# Patient Record
Sex: Female | Born: 1945 | Race: White | Hispanic: No | Smoking: Former smoker
Health system: Southern US, Community
[De-identification: ages and names within clinical notes are randomized; demographics above are authoritative.]

## PROBLEM LIST (undated history)

## (undated) DIAGNOSIS — T8859XA Other complications of anesthesia, initial encounter: Secondary | ICD-10-CM

## (undated) DIAGNOSIS — Z972 Presence of dental prosthetic device (complete) (partial): Secondary | ICD-10-CM

## (undated) DIAGNOSIS — E039 Hypothyroidism, unspecified: Secondary | ICD-10-CM

## (undated) DIAGNOSIS — Z8489 Family history of other specified conditions: Secondary | ICD-10-CM

## (undated) DIAGNOSIS — I10 Essential (primary) hypertension: Secondary | ICD-10-CM

## (undated) DIAGNOSIS — M5430 Sciatica, unspecified side: Secondary | ICD-10-CM

## (undated) HISTORY — PX: CHOLECYSTECTOMY: SHX55

## (undated) HISTORY — PX: ABDOMINAL HYSTERECTOMY: SHX81

## (undated) HISTORY — PX: PARATHYROIDECTOMY: SHX19

---

## 2003-10-26 ENCOUNTER — Encounter: Payer: Self-pay | Admitting: Family Medicine

## 2003-11-17 ENCOUNTER — Encounter: Payer: Self-pay | Admitting: Family Medicine

## 2003-12-17 ENCOUNTER — Encounter: Payer: Self-pay | Admitting: Family Medicine

## 2004-01-17 ENCOUNTER — Encounter: Payer: Self-pay | Admitting: Family Medicine

## 2004-02-17 ENCOUNTER — Encounter: Payer: Self-pay | Admitting: Family Medicine

## 2004-03-16 ENCOUNTER — Encounter: Payer: Self-pay | Admitting: Family Medicine

## 2004-04-16 ENCOUNTER — Encounter: Payer: Self-pay | Admitting: Family Medicine

## 2004-05-10 ENCOUNTER — Ambulatory Visit: Payer: Self-pay | Admitting: Family Medicine

## 2004-05-16 ENCOUNTER — Encounter: Payer: Self-pay | Admitting: Family Medicine

## 2004-06-16 ENCOUNTER — Encounter: Payer: Self-pay | Admitting: Family Medicine

## 2004-07-16 ENCOUNTER — Encounter: Payer: Self-pay | Admitting: Family Medicine

## 2004-08-16 ENCOUNTER — Encounter: Payer: Self-pay | Admitting: Family Medicine

## 2004-09-16 ENCOUNTER — Encounter: Payer: Self-pay | Admitting: Family Medicine

## 2004-10-16 ENCOUNTER — Encounter: Payer: Self-pay | Admitting: Family Medicine

## 2004-11-16 ENCOUNTER — Encounter: Payer: Self-pay | Admitting: Family Medicine

## 2004-12-16 ENCOUNTER — Encounter: Payer: Self-pay | Admitting: Family Medicine

## 2004-12-28 ENCOUNTER — Ambulatory Visit: Payer: Self-pay | Admitting: Ophthalmology

## 2005-01-16 ENCOUNTER — Encounter: Payer: Self-pay | Admitting: Family Medicine

## 2005-02-16 ENCOUNTER — Encounter: Payer: Self-pay | Admitting: Family Medicine

## 2005-03-16 ENCOUNTER — Encounter: Payer: Self-pay | Admitting: Family Medicine

## 2005-04-16 ENCOUNTER — Encounter: Payer: Self-pay | Admitting: Family Medicine

## 2005-05-16 ENCOUNTER — Encounter: Payer: Self-pay | Admitting: Family Medicine

## 2005-06-16 ENCOUNTER — Encounter: Payer: Self-pay | Admitting: Family Medicine

## 2005-07-16 ENCOUNTER — Encounter: Payer: Self-pay | Admitting: Family Medicine

## 2005-07-18 ENCOUNTER — Ambulatory Visit: Payer: Self-pay | Admitting: Family Medicine

## 2005-08-16 ENCOUNTER — Encounter: Payer: Self-pay | Admitting: Family Medicine

## 2005-09-16 ENCOUNTER — Encounter: Payer: Self-pay | Admitting: Family Medicine

## 2005-10-16 ENCOUNTER — Encounter: Payer: Self-pay | Admitting: Family Medicine

## 2005-11-16 ENCOUNTER — Encounter: Payer: Self-pay | Admitting: Family Medicine

## 2005-12-16 ENCOUNTER — Encounter: Payer: Self-pay | Admitting: Family Medicine

## 2005-12-19 ENCOUNTER — Ambulatory Visit: Payer: Self-pay | Admitting: Family Medicine

## 2006-01-16 ENCOUNTER — Encounter: Payer: Self-pay | Admitting: Family Medicine

## 2006-02-16 ENCOUNTER — Encounter: Payer: Self-pay | Admitting: Family Medicine

## 2006-03-17 ENCOUNTER — Encounter: Payer: Self-pay | Admitting: Family Medicine

## 2006-04-17 ENCOUNTER — Encounter: Payer: Self-pay | Admitting: Family Medicine

## 2006-05-17 ENCOUNTER — Encounter: Payer: Self-pay | Admitting: Family Medicine

## 2006-06-17 ENCOUNTER — Encounter: Payer: Self-pay | Admitting: Family Medicine

## 2007-03-08 ENCOUNTER — Emergency Department: Payer: Self-pay | Admitting: Emergency Medicine

## 2009-10-14 ENCOUNTER — Ambulatory Visit: Payer: Self-pay | Admitting: Family Medicine

## 2009-12-27 ENCOUNTER — Ambulatory Visit: Payer: Self-pay | Admitting: Family Medicine

## 2010-02-11 ENCOUNTER — Ambulatory Visit: Payer: Self-pay | Admitting: Family Medicine

## 2010-11-09 ENCOUNTER — Ambulatory Visit: Payer: Self-pay | Admitting: Family Medicine

## 2011-11-14 ENCOUNTER — Ambulatory Visit: Payer: Self-pay | Admitting: Family Medicine

## 2012-06-17 ENCOUNTER — Ambulatory Visit: Payer: Self-pay | Admitting: Gastroenterology

## 2012-06-18 LAB — PATHOLOGY REPORT

## 2012-11-14 ENCOUNTER — Ambulatory Visit: Payer: Self-pay | Admitting: Family Medicine

## 2013-12-16 ENCOUNTER — Ambulatory Visit: Payer: Self-pay | Admitting: Ophthalmology

## 2013-12-16 LAB — SEDIMENTATION RATE: ERYTHROCYTE SED RATE: 28 mm/h (ref 0–30)

## 2013-12-22 ENCOUNTER — Ambulatory Visit: Payer: Self-pay | Admitting: Ophthalmology

## 2014-05-18 ENCOUNTER — Other Ambulatory Visit: Payer: Self-pay

## 2014-05-18 DIAGNOSIS — Z1231 Encounter for screening mammogram for malignant neoplasm of breast: Secondary | ICD-10-CM

## 2014-06-02 ENCOUNTER — Ambulatory Visit: Payer: Self-pay

## 2014-11-02 ENCOUNTER — Ambulatory Visit
Admission: RE | Admit: 2014-11-02 | Discharge: 2014-11-02 | Disposition: A | Discharge status: Home / Self Care | Payer: Medicare Other | Source: Ambulatory Visit | Attending: Family Medicine | Admitting: Family Medicine

## 2014-11-02 DIAGNOSIS — Z1231 Encounter for screening mammogram for malignant neoplasm of breast: Secondary | ICD-10-CM | POA: Diagnosis present

## 2015-10-19 ENCOUNTER — Other Ambulatory Visit: Payer: Self-pay | Admitting: Family Medicine

## 2015-10-19 DIAGNOSIS — Z1231 Encounter for screening mammogram for malignant neoplasm of breast: Secondary | ICD-10-CM

## 2015-11-10 ENCOUNTER — Ambulatory Visit
Admission: RE | Admit: 2015-11-10 | Discharge: 2015-11-10 | Disposition: A | Discharge status: Home / Self Care | Payer: Medicare Other | Source: Ambulatory Visit | Attending: Family Medicine | Admitting: Family Medicine

## 2015-11-10 DIAGNOSIS — Z1231 Encounter for screening mammogram for malignant neoplasm of breast: Secondary | ICD-10-CM | POA: Insufficient documentation

## 2016-04-25 ENCOUNTER — Other Ambulatory Visit: Payer: Self-pay | Admitting: Family Medicine

## 2016-04-25 ENCOUNTER — Ambulatory Visit
Admission: RE | Admit: 2016-04-25 | Discharge: 2016-04-25 | Disposition: A | Discharge status: Home / Self Care | Payer: Medicare HMO | Source: Ambulatory Visit | Attending: Family Medicine | Admitting: Family Medicine

## 2016-04-25 ENCOUNTER — Ambulatory Visit: Payer: Medicare HMO

## 2016-04-25 DIAGNOSIS — R937 Abnormal findings on diagnostic imaging of other parts of musculoskeletal system: Secondary | ICD-10-CM | POA: Diagnosis not present

## 2016-04-25 DIAGNOSIS — M79661 Pain in right lower leg: Secondary | ICD-10-CM

## 2016-11-03 ENCOUNTER — Other Ambulatory Visit: Payer: Self-pay | Admitting: Family Medicine

## 2016-11-03 DIAGNOSIS — Z1231 Encounter for screening mammogram for malignant neoplasm of breast: Secondary | ICD-10-CM

## 2016-11-16 ENCOUNTER — Ambulatory Visit
Admission: RE | Admit: 2016-11-16 | Discharge: 2016-11-16 | Disposition: A | Discharge status: Home / Self Care | Payer: Medicare HMO | Source: Ambulatory Visit | Attending: Family Medicine | Admitting: Family Medicine

## 2016-11-16 DIAGNOSIS — Z1231 Encounter for screening mammogram for malignant neoplasm of breast: Secondary | ICD-10-CM | POA: Diagnosis present

## 2017-07-09 ENCOUNTER — Other Ambulatory Visit: Payer: Self-pay | Admitting: Medical Oncology

## 2017-07-09 ENCOUNTER — Ambulatory Visit
Admission: RE | Admit: 2017-07-09 | Discharge: 2017-07-09 | Disposition: A | Discharge status: Home / Self Care | Payer: Medicare HMO | Source: Ambulatory Visit | Attending: Medical Oncology | Admitting: Medical Oncology

## 2017-07-09 DIAGNOSIS — R0989 Other specified symptoms and signs involving the circulatory and respiratory systems: Secondary | ICD-10-CM

## 2017-07-09 DIAGNOSIS — R05 Cough: Secondary | ICD-10-CM | POA: Diagnosis present

## 2017-07-09 DIAGNOSIS — R058 Other specified cough: Secondary | ICD-10-CM

## 2017-07-09 DIAGNOSIS — Z87891 Personal history of nicotine dependence: Secondary | ICD-10-CM | POA: Insufficient documentation

## 2017-07-09 DIAGNOSIS — R509 Fever, unspecified: Secondary | ICD-10-CM | POA: Diagnosis present

## 2017-07-09 DIAGNOSIS — J984 Other disorders of lung: Secondary | ICD-10-CM | POA: Diagnosis not present

## 2017-07-13 ENCOUNTER — Other Ambulatory Visit: Payer: Self-pay

## 2017-07-13 ENCOUNTER — Emergency Department
Admission: EM | Admit: 2017-07-13 | Discharge: 2017-07-14 | Disposition: A | Discharge status: Home / Self Care | Payer: Medicare HMO | Attending: Emergency Medicine | Admitting: Emergency Medicine

## 2017-07-13 DIAGNOSIS — E876 Hypokalemia: Secondary | ICD-10-CM | POA: Insufficient documentation

## 2017-07-13 DIAGNOSIS — R509 Fever, unspecified: Secondary | ICD-10-CM | POA: Diagnosis present

## 2017-07-13 DIAGNOSIS — J069 Acute upper respiratory infection, unspecified: Secondary | ICD-10-CM | POA: Insufficient documentation

## 2017-07-13 LAB — BASIC METABOLIC PANEL
Anion gap: 10 (ref 5–15)
BUN: 15 mg/dL (ref 8–23)
CO2: 23 mmol/L (ref 22–32)
CREATININE: 0.77 mg/dL (ref 0.44–1.00)
Calcium: 8.6 mg/dL — ABNORMAL LOW (ref 8.9–10.3)
Chloride: 102 mmol/L (ref 98–111)
GFR calc Af Amer: >60 mL/min (ref >60)
GLUCOSE: 106 mg/dL — AB (ref 70–99)
Potassium: 3 mmol/L — ABNORMAL LOW (ref 3.5–5.1)
SODIUM: 135 mmol/L (ref 135–145)

## 2017-07-13 LAB — URINALYSIS, COMPLETE (UACMP) WITH MICROSCOPIC
Bilirubin Urine: NEGATIVE
GLUCOSE, UA: NEGATIVE mg/dL
HGB URINE DIPSTICK: NEGATIVE
Ketones, ur: NEGATIVE mg/dL
Nitrite: NEGATIVE
PROTEIN: NEGATIVE mg/dL
SPECIFIC GRAVITY, URINE: 1.005 (ref 1.005–1.030)
pH: 6 (ref 5.0–8.0)

## 2017-07-13 LAB — CBC WITH DIFFERENTIAL/PLATELET
Basophils Absolute: 0.1 10*3/uL (ref 0–0.1)
Basophils Relative: 1 %
EOS ABS: 0.1 10*3/uL (ref 0–0.7)
Eosinophils Relative: 0 %
HCT: 41 % (ref 35.0–47.0)
Hemoglobin: 14.1 g/dL (ref 12.0–16.0)
LYMPHS ABS: 2.4 10*3/uL (ref 1.0–3.6)
Lymphocytes Relative: 19 %
MCH: 29.9 pg (ref 26.0–34.0)
MCHC: 34.4 g/dL (ref 32.0–36.0)
MCV: 87 fL (ref 80.0–100.0)
MONO ABS: 0.9 10*3/uL (ref 0.2–0.9)
Monocytes Relative: 7 %
Neutro Abs: 9.4 10*3/uL — ABNORMAL HIGH (ref 1.4–6.5)
Neutrophils Relative %: 73 %
Platelets: 281 10*3/uL (ref 150–440)
RBC: 4.72 MIL/uL (ref 3.80–5.20)
RDW: 13.5 % (ref 11.5–14.5)
WBC: 13 10*3/uL — AB (ref 3.6–11.0)

## 2017-07-13 MED ORDER — POTASSIUM CHLORIDE CRYS ER 20 MEQ PO TBCR
40.0000 meq | EXTENDED_RELEASE_TABLET | Freq: Once | ORAL | Status: AC
  Administered 2017-07-14: 40 meq via ORAL
  Filled 2017-07-13: qty 2

## 2017-07-13 NOTE — ED Triage Notes (Signed)
Pt in with co fever and cough since last Wednesday. States saw PA Wednesday and given doxycycline and otc decongestant. Unsure of dx, states cxr was neg. Pt here for persistent symptoms.

## 2017-07-13 NOTE — ED Provider Notes (Signed)
[facility] Emergency Department Provider Note  ____________________________________________   I have reviewed the triage vital signs and the nursing notes.   HISTORY  Chief Complaint Fever   History limited by: Not Limited   HPI Toni Mcgee is a 72 y.o. female who presents to the emergency department today because of concerns for continued fever.  She states she has had fever for over 1 week.  T-max of 100.9.  Patient saw a PA for this a few days ago and was diagnosed with community-acquired pneumonia was put on doxycycline.  She then subsequently had an x-ray which was negative for pneumonia.  Patient states that she continues to have the fevers.  In addition she has felt somewhat weak and lethargic.  She has been taking care to make sure she is getting enough fluids.  She denies any chest pain.  No nausea vomiting or diarrhea. She has not taken any medication for her fever. Denies any rashes.   Per medical record review patient has a history of recent visit to [place] where she was afebrile, started on doxycycline.  No past medical history on file.  There are no active problems to display for this patient.   No past surgical history on file.  Prior to Admission medications   Not on File    Allergies Penicillins; Sulfa antibiotics; and Tylenol [acetaminophen]  No family history on file.  Social History Former smoker  Review of Systems Constitutional: Positive for fever. Eyes: No visual changes. ENT: No sore throat. Cardiovascular: Denies chest pain. Respiratory: Denies shortness of breath. Gastrointestinal: No abdominal pain.  No nausea, no vomiting.  No diarrhea.   Genitourinary: Negative for dysuria. Musculoskeletal: Negative for back pain. Skin: Negative for rash. Neurological: Negative for headaches, focal weakness or numbness.  ____________________________________________   PHYSICAL EXAM:  VITAL SIGNS: ED Triage Vitals  [07/13/17 2212]  Enc Vitals Group     BP 132/81     Pulse Rate 83     Resp 20     Temp 98.9 F (37.2 C)     Temp Source Oral     SpO2 95 %     Weight 194 lb (88 kg)     Height 5\' 1"  (1.549 m)     Head Circumference      Peak Flow      Pain Score 0   Constitutional: Alert and oriented.  Eyes: Conjunctivae are normal.  ENT      Head: Normocephalic and atraumatic.      Nose: No congestion/rhinnorhea.      Mouth/Throat: Mucous membranes are moist.      Neck: No stridor. Hematological/Lymphatic/Immunilogical: No cervical lymphadenopathy. Cardiovascular: Normal rate, regular rhythm.  No murmurs, rubs, or gallops.  Respiratory: Normal respiratory effort without tachypnea nor retractions. Breath sounds are clear and equal bilaterally. No wheezes/rales/rhonchi. Gastrointestinal: Soft and non tender. No rebound. No guarding.  Genitourinary: Deferred Musculoskeletal: Normal range of motion in all extremities. No lower extremity edema. Neurologic:  Normal speech and language. No gross focal neurologic deficits are appreciated.  Skin:  Skin is warm, dry and intact. No rash noted. Psychiatric: Mood and affect are normal. Speech and behavior are normal. Patient exhibits appropriate insight and judgment.  ____________________________________________    LABS (pertinent positives/negatives)  CBC wbc 13.0, hgb 14.1, plt 281 BMP na 135, k 3.0, glu 106, cr 0.77 UA not consistent with infection  ____________________________________________   EKG  None  ____________________________________________    RADIOLOGY  None  ____________________________________________  PROCEDURES  Procedures  ____________________________________________   INITIAL IMPRESSION / ASSESSMENT AND PLAN / ED COURSE  Pertinent labs & imaging results that were available during my care of the patient were reviewed by me and considered in my medical decision making (see chart for details).   Patient  presented to the emergency department today because of concerns for continued fever.  Patient has been seen by primary care for this and was put on doxycycline.  Additionally had a negative chest x-ray.  Work-up does show a mild leukocytosis.  Patient was also found to be hypokalemic.  Urine without concerning signs for infection.  This point I think likely upper respiratory infection likely secondary to virus.  Patient is already on get antibiotics if it is a community-acquired pneumonia.  Did discuss this with patient.  Did encourage patient to continue antibiotics.  Also give patient potassium repletion here in the emergency department.   ____________________________________________   FINAL CLINICAL IMPRESSION(S) / ED DIAGNOSES  Final diagnoses:  Upper respiratory tract infection, unspecified type  Hypokalemia     Note: This dictation was prepared with Dragon dictation. Any transcriptional errors that result from this process are unintentional     Phineas SemenGoodman, Justeen Hehr, MD 07/13/17 [phone]

## 2017-07-13 NOTE — Discharge Instructions (Addendum)
Procedures - None ° °Please seek medical attention for any high fevers, chest pain, shortness of breath, change in behavior, persistent vomiting, bloody stool or any other new or concerning symptoms. ° ° ° °

## 2017-10-03 ENCOUNTER — Other Ambulatory Visit: Payer: Self-pay | Admitting: Family Medicine

## 2017-10-03 DIAGNOSIS — Z1231 Encounter for screening mammogram for malignant neoplasm of breast: Secondary | ICD-10-CM

## 2017-11-19 ENCOUNTER — Ambulatory Visit
Admission: RE | Admit: 2017-11-19 | Discharge: 2017-11-19 | Disposition: A | Discharge status: Home / Self Care | Payer: Medicare HMO | Source: Ambulatory Visit | Attending: Family Medicine | Admitting: Family Medicine

## 2017-11-19 DIAGNOSIS — Z1231 Encounter for screening mammogram for malignant neoplasm of breast: Secondary | ICD-10-CM

## 2018-10-07 ENCOUNTER — Other Ambulatory Visit: Payer: Self-pay | Admitting: Family Medicine

## 2018-10-07 DIAGNOSIS — Z1231 Encounter for screening mammogram for malignant neoplasm of breast: Secondary | ICD-10-CM

## 2018-11-26 ENCOUNTER — Ambulatory Visit
Admission: RE | Admit: 2018-11-26 | Discharge: 2018-11-26 | Disposition: A | Discharge status: Home / Self Care | Payer: Medicare HMO | Source: Ambulatory Visit | Attending: Family Medicine | Admitting: Family Medicine

## 2018-11-26 DIAGNOSIS — Z1231 Encounter for screening mammogram for malignant neoplasm of breast: Secondary | ICD-10-CM | POA: Diagnosis present

## 2019-02-24 ENCOUNTER — Ambulatory Visit: Payer: Medicare HMO

## 2019-03-14 ENCOUNTER — Ambulatory Visit: Payer: Medicare HMO

## 2019-04-30 ENCOUNTER — Encounter: Payer: Self-pay | Admitting: Emergency Medicine

## 2019-04-30 ENCOUNTER — Other Ambulatory Visit: Payer: Self-pay

## 2019-04-30 DIAGNOSIS — R42 Dizziness and giddiness: Secondary | ICD-10-CM | POA: Diagnosis not present

## 2019-04-30 DIAGNOSIS — Z87891 Personal history of nicotine dependence: Secondary | ICD-10-CM | POA: Diagnosis not present

## 2019-04-30 DIAGNOSIS — I10 Essential (primary) hypertension: Secondary | ICD-10-CM | POA: Insufficient documentation

## 2019-04-30 DIAGNOSIS — R2681 Unsteadiness on feet: Secondary | ICD-10-CM | POA: Insufficient documentation

## 2019-04-30 DIAGNOSIS — R197 Diarrhea, unspecified: Secondary | ICD-10-CM | POA: Diagnosis not present

## 2019-04-30 LAB — COMPREHENSIVE METABOLIC PANEL
ALT: 28 U/L (ref 0–44)
AST: 27 U/L (ref 15–41)
Albumin: 3.9 g/dL (ref 3.5–5.0)
Alkaline Phosphatase: 52 U/L (ref 38–126)
Anion gap: 8 (ref 5–15)
BUN: 11 mg/dL (ref 8–23)
CO2: 27 mmol/L (ref 22–32)
Calcium: 9.4 mg/dL (ref 8.9–10.3)
Chloride: 107 mmol/L (ref 98–111)
Creatinine, Ser: 0.8 mg/dL (ref 0.44–1.00)
GFR calc Af Amer: >60 mL/min (ref >60)
GFR calc non Af Amer: >60 mL/min (ref >60)
Glucose, Bld: 124 mg/dL — ABNORMAL HIGH (ref 70–99)
Potassium: 4.1 mmol/L (ref 3.5–5.1)
Sodium: 142 mmol/L (ref 135–145)
Total Bilirubin: 0.9 mg/dL (ref 0.3–1.2)
Total Protein: 7.7 g/dL (ref 6.5–8.1)

## 2019-04-30 LAB — CBC WITH DIFFERENTIAL/PLATELET
Abs Immature Granulocytes: 0.04 10*3/uL (ref 0.00–0.07)
Basophils Absolute: 0.1 10*3/uL (ref 0.0–0.1)
Basophils Relative: 1 %
Eosinophils Absolute: 0.2 10*3/uL (ref 0.0–0.5)
Eosinophils Relative: 2 %
HCT: 45 % (ref 36.0–46.0)
Hemoglobin: 14.7 g/dL (ref 12.0–15.0)
Immature Granulocytes: 0 %
Lymphocytes Relative: 32 %
Lymphs Abs: 3.3 10*3/uL (ref 0.7–4.0)
MCH: 29.5 pg (ref 26.0–34.0)
MCHC: 32.7 g/dL (ref 30.0–36.0)
MCV: 90.2 fL (ref 80.0–100.0)
Monocytes Absolute: 0.6 10*3/uL (ref 0.1–1.0)
Monocytes Relative: 6 %
Neutro Abs: 5.9 10*3/uL (ref 1.7–7.7)
Neutrophils Relative %: 59 %
Platelets: 291 10*3/uL (ref 150–400)
RBC: 4.99 MIL/uL (ref 3.87–5.11)
RDW: 14.4 % (ref 11.5–15.5)
WBC: 10.1 10*3/uL (ref 4.0–10.5)
nRBC: 0 % (ref 0.0–0.2)

## 2019-04-30 LAB — URINALYSIS, COMPLETE (UACMP) WITH MICROSCOPIC
Bilirubin Urine: NEGATIVE
Glucose, UA: NEGATIVE mg/dL
Hgb urine dipstick: NEGATIVE
Ketones, ur: NEGATIVE mg/dL
Nitrite: NEGATIVE
Protein, ur: NEGATIVE mg/dL
Specific Gravity, Urine: 1.002 — ABNORMAL LOW (ref 1.005–1.030)
pH: 6 (ref 5.0–8.0)

## 2019-04-30 LAB — TROPONIN I (HIGH SENSITIVITY): Troponin I (High Sensitivity): 5 ng/L (ref <18)

## 2019-04-30 NOTE — ED Triage Notes (Signed)
Pt to triage via w/c with no distress noted, mask in place; pt reports had diarrhea last Thursday that lasted until Sunday; tonight began having dizziness with no accomp symptoms

## 2019-05-01 ENCOUNTER — Emergency Department
Admission: EM | Admit: 2019-05-01 | Discharge: 2019-05-01 | Disposition: A | Discharge status: Home / Self Care | Payer: Medicare HMO | Attending: Emergency Medicine | Admitting: Emergency Medicine

## 2019-05-01 ENCOUNTER — Emergency Department: Payer: Medicare HMO

## 2019-05-01 DIAGNOSIS — R42 Dizziness and giddiness: Secondary | ICD-10-CM

## 2019-05-01 HISTORY — DX: Essential (primary) hypertension: I10

## 2019-05-01 NOTE — ED Provider Notes (Signed)
[facility] Emergency Department Provider Note  ____________________________________________   First MD Initiated Contact with Patient 05/01/19 0147     (approximate)  I have reviewed the triage vital signs and the nursing notes.   HISTORY  Chief Complaint Dizziness   HPI Toni Mcgee is a 74 y.o. female with history of hypertension and recent nonbloody diarrhea that lasted 4 days last week presents to the emergency department secondary to acute onset of dizziness and unsteady gait tonight.  Patient states initial episode occurred while she was standing and subsequent episode occurred when she change positions from laying to sitting up.  Patient denies any chest pain no shortness of breath.  Patient denies any weakness or numbness.  Patient denies any change in speech.        Past Medical History:  Diagnosis Date  . Hypertension     There are no problems to display for this patient.   Past Surgical History:  Procedure Laterality Date  . ABDOMINAL HYSTERECTOMY    . CHOLECYSTECTOMY    . PARATHYROIDECTOMY      Prior to Admission medications   Not on File    Allergies Penicillins, Sulfa antibiotics, and Tylenol [acetaminophen]  Family History  Problem Relation Age of Onset  . Breast cancer Neg Hx     Social History Social History   Tobacco Use  . Smoking status: Former Games developer  . Smokeless tobacco: Never Used  Substance Use Topics  . Alcohol use: Not on file  . Drug use: Not on file    Review of Systems Constitutional: No fever/chills Eyes: No visual changes. ENT: No sore throat. Cardiovascular: Denies chest pain. Respiratory: Denies shortness of breath. Gastrointestinal: No abdominal pain.  No nausea, no vomiting.  No diarrhea.  No constipation. Genitourinary: Negative for dysuria. Musculoskeletal: Negative for neck pain.  Negative for back pain. Integumentary: Negative for rash. Neurological: Negative for headaches,  focal weakness or numbness.  Positive for dizziness and an unsteady gait   ____________________________________________   PHYSICAL EXAM:  VITAL SIGNS: ED Triage Vitals  Enc Vitals Group     BP 04/30/19 2041 (!) 173/68     Pulse Rate 04/30/19 2041 60     Resp 04/30/19 2041 18     Temp 04/30/19 2041 98 F (36.7 C)     Temp src --      SpO2 04/30/19 2041 98 %     Weight 04/30/19 2038 88.9 kg (196 lb)     Height 04/30/19 2038 1.6 m (5\' 3" )     Head Circumference --      Peak Flow --      Pain Score 04/30/19 2038 0     Pain Loc --      Pain Edu? --      Excl. in GC? --     Constitutional: Alert and oriented.  Eyes: Conjunctivae are normal.  ENT: Clear fluid noted posterior TM bilaterally. Mouth/Throat: Patient is wearing a mask. Neck: No stridor.  No meningeal signs.   Cardiovascular: Normal rate, regular rhythm. Good peripheral circulation. Grossly normal heart sounds. Respiratory: Normal respiratory effort.  No retractions. Gastrointestinal: Soft and nontender. No distention.  Musculoskeletal: No lower extremity tenderness nor edema. No gross deformities of extremities. Neurologic:  Normal speech and language. No gross focal neurologic deficits are appreciated.  Skin:  Skin is warm, dry and intact. Psychiatric: Mood and affect are normal. Speech and behavior are normal.  ____________________________________________   LABS (all labs ordered are listed,  but only abnormal results are displayed)  Labs Reviewed  COMPREHENSIVE METABOLIC PANEL - Abnormal; Notable for the following components:      Result Value   Glucose, Bld 124 (*)    All other components within normal limits  URINALYSIS, COMPLETE (UACMP) WITH MICROSCOPIC - Abnormal; Notable for the following components:   Color, Urine STRAW (*)    APPearance CLEAR (*)    Specific Gravity, Urine 1.002 (*)    Leukocytes,Ua TRACE (*)    Bacteria, UA RARE (*)    All other components within normal limits  CBC WITH  DIFFERENTIAL/PLATELET  TROPONIN I (HIGH SENSITIVITY)   ____________________________________________  EKG  ED ECG REPORT I, Bardwell N Fleeta Kunde, the attending physician, personally viewed and interpreted this ECG.   Date: 05/01/2019  EKG Time: 8:41 PM  Rate: 61  Rhythm: Normal sinus rhythm  Axis: Normal  Intervals: Normal  ST&T Change: None  ____________________________________________  RADIOLOGY I, Vilonia N Jabron Weese, personally viewed and evaluated these images (plain radiographs) as part of my medical decision making, as well as reviewing the written report by the radiologist.  ED MD interpretation: Unremarkable brain MRI per radiologist.  Official radiology   MR BRAIN WO CONTRAST  Result Date: 05/01/2019 CLINICAL DATA:  New onset dizziness and ataxia EXAM: MRI HEAD WITHOUT CONTRAST TECHNIQUE: Multiplanar, multiecho pulse sequences of the brain and surrounding structures were obtained without intravenous contrast. COMPARISON:  None. FINDINGS: Brain: No acute infarction, hemorrhage, hydrocephalus, extra-axial collection or mass lesion. Few tiny remote insults in the cerebral white matter, less than commonly seen for age. Brain volume is normal. Vascular: Normal flow voids Skull and upper cervical spine: Degenerative spurring in the visualized spine. There is C2-3 facet ankylosis. No focal lesion seen. Sinuses/Orbits: Unremarkable. No mastoid or middle ear opacification. IMPRESSION: Unremarkable brain MRI.  No cerebellar infarct. Electronically Signed   By: Monte Fantasia M.D.   On: 05/01/2019 04:19      Procedures   ____________________________________________   INITIAL IMPRESSION / MDM / ASSESSMENT AND PLAN / ED COURSE  As part of my medical decision making, I reviewed the following data within the [place] NUMBER 74 year old female presented with above-stated history and physical exam secondary to dizziness and ataxia differential diagnosis including but not  limited to orthostatic hypotension, cardiac arrhythmia CVA vertigo secondary to inner ear pathology.  Patient's laboratory data unremarkable.  MRI of the brain also unremarkable.  ____________________________________________  FINAL CLINICAL IMPRESSION(S) / ED DIAGNOSES  Final diagnoses:  Dizziness     MEDICATIONS GIVEN DURING THIS VISIT:  Medications - No data to display   ED Discharge Orders    None      *Please note:  Reva Pinkley Figler was evaluated in Emergency Department on 05/01/2019 for the symptoms described in the history of present illness. She was evaluated in the context of the global COVID-19 pandemic, which necessitated consideration that the patient might be at risk for infection with the SARS-CoV-2 virus that causes COVID-19. Institutional protocols and algorithms that pertain to the evaluation of patients at risk for COVID-19 are in a state of rapid change based on information released by regulatory bodies including the CDC and federal and state organizations. These policies and algorithms were followed during the patient's care in the ED.  Some ED evaluations and interventions may be delayed as a result of limited staffing during the pandemic.*  Note:  This document was prepared using Dragon voice recognition software and may include unintentional dictation errors.   Owens Shark,  Enedina Finner, MD 05/01/19 [phone]

## 2019-05-01 NOTE — ED Notes (Signed)
Patient discharged to home per MD order. Patient in stable condition, and deemed medically cleared by ED provider for discharge. Discharge instructions reviewed with patient/family using "Teach Back"; verbalized understanding of medication education and administration, and information about follow-up care. Denies further concerns. ° °

## 2019-10-28 ENCOUNTER — Other Ambulatory Visit: Payer: Self-pay | Admitting: Family Medicine

## 2019-10-28 DIAGNOSIS — Z1231 Encounter for screening mammogram for malignant neoplasm of breast: Secondary | ICD-10-CM

## 2019-11-27 ENCOUNTER — Ambulatory Visit
Admission: RE | Admit: 2019-11-27 | Discharge: 2019-11-27 | Disposition: A | Discharge status: Home / Self Care | Payer: Medicare HMO | Source: Ambulatory Visit | Attending: Family Medicine | Admitting: Family Medicine

## 2019-11-27 ENCOUNTER — Other Ambulatory Visit: Payer: Self-pay

## 2019-11-27 DIAGNOSIS — Z1231 Encounter for screening mammogram for malignant neoplasm of breast: Secondary | ICD-10-CM | POA: Insufficient documentation

## 2020-01-20 ENCOUNTER — Ambulatory Visit
Admission: EM | Admit: 2020-01-20 | Discharge: 2020-01-20 | Disposition: A | Discharge status: Home / Self Care | Payer: Medicare HMO | Attending: Physician Assistant | Admitting: Physician Assistant

## 2020-01-20 ENCOUNTER — Ambulatory Visit (INDEPENDENT_AMBULATORY_CARE_PROVIDER_SITE_OTHER): Payer: Medicare HMO

## 2020-01-20 ENCOUNTER — Other Ambulatory Visit: Payer: Self-pay

## 2020-01-20 DIAGNOSIS — R5383 Other fatigue: Secondary | ICD-10-CM

## 2020-01-20 DIAGNOSIS — Z20822 Contact with and (suspected) exposure to covid-19: Secondary | ICD-10-CM | POA: Insufficient documentation

## 2020-01-20 DIAGNOSIS — R058 Other specified cough: Secondary | ICD-10-CM

## 2020-01-20 DIAGNOSIS — R0602 Shortness of breath: Secondary | ICD-10-CM | POA: Insufficient documentation

## 2020-01-20 DIAGNOSIS — J029 Acute pharyngitis, unspecified: Secondary | ICD-10-CM

## 2020-01-20 DIAGNOSIS — B349 Viral infection, unspecified: Secondary | ICD-10-CM | POA: Diagnosis present

## 2020-01-20 DIAGNOSIS — R059 Cough, unspecified: Secondary | ICD-10-CM | POA: Diagnosis not present

## 2020-01-20 MED ORDER — ALBUTEROL SULFATE HFA 108 (90 BASE) MCG/ACT IN AERS: 1.0000 | INHALATION_SPRAY | Freq: Four times a day (QID) | RESPIRATORY_TRACT | 0 refills | Status: AC | PRN

## 2020-01-20 NOTE — ED Triage Notes (Signed)
Pt reports having productive cough, fatigue, sore throat and swollen glands in neck. Also reports having headache. Symptoms began Saturday.

## 2020-01-20 NOTE — ED Provider Notes (Signed)
[facility]    CSN: [id number] Arrival date & time: 01/20/20  1110      History   Chief Complaint Chief Complaint  Patient presents with  . Cough    HPI Toni Mcgee is a 75 y.o. female presenting for 3-day history of productive cough, fatigue, sore throat, chest tightness and occasional SOB. Also admits to lymphadenopathy of neck.  Patient states she has been having headaches as well.  Patient denies known Covid exposure and has been fully vaccinated for COVID-19.  Patient has been around her grandson who did recently get diagnosed with pneumonia.  Patient states he had negative Covid and flu testing.  Patient denies any fever, weakness, chest pain, abdominal pain, N/V/D, or changes in smell and taste.  She has been taking Mucinex and aspirin for symptoms.  Past medical history significant for hypertension.  She denies any cardiopulmonary disease.  Patient is a former smoker.  Patient states she smoked from [phone].  She has her grandson is a smoker but smokes outside.  No other complaints or concerns at this time.  HPI  Past Medical History:  Diagnosis Date  . Hypertension     There are no problems to display for this patient.   Past Surgical History:  Procedure Laterality Date  . ABDOMINAL HYSTERECTOMY    . CHOLECYSTECTOMY    . PARATHYROIDECTOMY      OB History   No obstetric history on file.      Home Medications    Prior to Admission medications   Medication Sig Start Date End Date Taking? Authorizing Provider  albuterol (VENTOLIN HFA) 108 (90 Base) MCG/ACT inhaler Inhale 1-2 puffs into the lungs every 6 (six) hours as needed for up to 10 days for wheezing or shortness of breath. 01/20/20 01/30/20 Yes Shirlee Latch, PA-C  levothyroxine (SYNTHROID) 100 MCG tablet Take by mouth. 12/22/19 12/21/20 Yes [provider]  metoprolol succinate (TOPROL-XL) 25 MG 24 hr tablet Take by mouth. 10/20/19 10/19/20 Yes [provider]    Family  History Family History  Problem Relation Age of Onset  . Breast cancer Neg Hx     Social History Social History   Tobacco Use  . Smoking status: Former Games developer  . Smokeless tobacco: Never Used  Vaping Use  . Vaping Use: Never used  Substance Use Topics  . Alcohol use: Yes    Comment: occ  . Drug use: Never     Allergies   Penicillins, Sulfa antibiotics, and Tylenol [acetaminophen]   Review of Systems Review of Systems  Constitutional: Positive for fatigue. Negative for chills, diaphoresis and fever.  HENT: Positive for congestion, postnasal drip, rhinorrhea and sore throat. Negative for ear pain, sinus pressure and sinus pain.   Respiratory: Positive for cough, chest tightness, shortness of breath and wheezing.   Cardiovascular: Negative for chest pain.  Gastrointestinal: Negative for abdominal pain, nausea and vomiting.  Musculoskeletal: Negative for arthralgias and myalgias.  Skin: Negative for rash.  Neurological: Positive for headaches. Negative for weakness.  Hematological: Positive for adenopathy.     Physical Exam Triage Vital Signs ED Triage Vitals  Enc Vitals Group     BP 01/20/20 1310 (!) 152/81     Pulse Rate 01/20/20 1310 69     Resp 01/20/20 1310 16     Temp 01/20/20 1310 98.6 F (37 C)     Temp Source 01/20/20 1310 Oral     SpO2 01/20/20 1310 98 %     Weight  01/20/20 1311 197 lb (89.4 kg)     Height 01/20/20 1311 5\' 2"  (1.575 m)     Head Circumference --      Peak Flow --      Pain Score 01/20/20 1311 1     Pain Loc --      Pain Edu? --      Excl. in GC? --    No data found.  Updated Vital Signs BP (!) 152/81   Pulse 69   Temp 98.6 F (37 C) (Oral)   Resp 16   Ht 5\' 2"  (1.575 m)   Wt 197 lb (89.4 kg)   SpO2 98%   BMI 36.03 kg/m       Physical Exam Vitals and nursing note reviewed.  Constitutional:      General: She is not in acute distress.    Appearance: Normal appearance. She is not ill-appearing or toxic-appearing.  HENT:      Head: Normocephalic and atraumatic.     Nose: Congestion and rhinorrhea present.     Mouth/Throat:     Mouth: Mucous membranes are moist.     Pharynx: Oropharynx is clear. Posterior oropharyngeal erythema present.  Eyes:     General: No scleral icterus.       Right eye: No discharge.        Left eye: No discharge.     Conjunctiva/sclera: Conjunctivae normal.  Cardiovascular:     Rate and Rhythm: Normal rate and regular rhythm.     Heart sounds: Normal heart sounds.  Pulmonary:     Effort: Pulmonary effort is normal. No respiratory distress.     Breath sounds: Normal breath sounds. No wheezing, rhonchi or rales.  Musculoskeletal:     Cervical back: Neck supple.  Skin:    General: Skin is dry.  Neurological:     General: No focal deficit present.     Mental Status: She is alert. Mental status is at baseline.     Motor: No weakness.     Gait: Gait normal.  Psychiatric:        Mood and Affect: Mood normal.        Behavior: Behavior normal.        Thought Content: Thought content normal.      UC Treatments / Results  Labs (all labs ordered are listed, but only abnormal results are displayed) Labs Reviewed  SARS CORONAVIRUS 2 (TAT 6-24 HRS)    EKG   Radiology DG Chest 2 View  Result Date: 01/20/2020 CLINICAL DATA:  Productive cough, fatigue and pharyngitis. EXAM: CHEST - 2 VIEW COMPARISON:  07/09/2017 FINDINGS: Normal sized heart. Mildly tortuous aorta. Mild to moderate central peribronchial thickening without significant change. Mild hyperexpansion of the lungs with mild progression. Thoracic spine degenerative changes. IMPRESSION: 1. No acute abnormality. 2. Stable mild to moderate chronic bronchitic changes with mildly progressive changes of COPD. Electronically Signed   By: 03/19/2020 M.D.   On: 01/20/2020 13:59    Procedures Procedures (including critical care time)  Medications Ordered in UC Medications - No data to display  Initial Impression / Assessment  and Plan / UC Course  I have reviewed the triage vital signs and the nursing notes.  Pertinent labs & imaging results that were available during my care of the patient were reviewed by me and considered in my medical decision making (see chart for details).   75 year old female with 3-day history of cough and congestion with other complaints of chest tightness and  shortness of breath at times.  Blood pressure slightly elevated 152/81 in clinic.  Temperature is normal.  Oxygen is 98%.  Chest is clear to auscultation.  Patient does admit concerned for pneumonia since her grandson has pneumonia.  Chest x-ray obtained today which is negative for acute abnormality.  Radiologist did note some chronic bronchitis changes with mildly progressive changes of COPD.  Patient is not a smoker.  She states that she smoked for about 2 years and stopped smoking in 1968.  Denies smoke exposure.  Advised patient of the radiologist report and suggested she speak with her PCP about this who may want to repeat a chest x-ray or perform pulmonary function testing.  Patient agreeable.  Suspect viral illness.  Covid testing obtained.  CDC guidelines, isolation protocol, and ED precautions discussed with patient.  I did give her a albuterol inhaler for the complaint of shortness of breath.  Advised increasing rest and fluids.  Advised over-the-counter Mucinex.  Advised to follow-up as needed.   Final Clinical Impressions(s) / UC Diagnoses   Final diagnoses:  Viral illness  Cough  Shortness of breath     Discharge Instructions     You have received COVID testing today either for positive exposure, concerning symptoms that could be related to COVID infection, screening purposes, or re-testing after confirmed positive.  Your test obtained today checks for active viral infection in the last 1-2 weeks. If your test is negative now, you can still test positive later. So, if you do develop symptoms you should either get  re-tested and/or isolate x 5 days and then strict mask use x 5 days (unvaccinated) or mask use x 10 days (vaccinated). Please follow CDC guidelines.  While Rapid antigen tests come back in 15-20 minutes, send out PCR/molecular test results typically come back within 1-3 days. In the mean time, if you are symptomatic, assume this could be a positive test and treat/monitor yourself as if you do have COVID.   We will call with test results if positive. Please download the MyChart app and set up a profile to access test results.   If symptomatic, go home and rest. Push fluids. Take Tylenol as needed for discomfort. Gargle warm salt water. Throat lozenges. Take Mucinex DM or Robitussin for cough. Humidifier in bedroom to ease coughing. Warm showers. Also review the COVID handout for more information.  COVID-19 INFECTION: The incubation period of COVID-19 is approximately 14 days after exposure, with most symptoms developing in roughly 4-5 days. Symptoms may range in severity from mild to critically severe. Roughly 80% of those infected will have mild symptoms. People of any age may become infected with COVID-19 and have the ability to transmit the virus. The most common symptoms include: fever, fatigue, cough, body aches, headaches, sore throat, nasal congestion, shortness of breath, nausea, vomiting, diarrhea, changes in smell and/or taste.    COURSE OF ILLNESS Some patients may begin with mild disease which can progress quickly into critical symptoms. If your symptoms are worsening please call ahead to the Emergency Department and proceed there for further treatment. Recovery time appears to be roughly 1-2 weeks for mild symptoms and 3-6 weeks for severe disease.   GO IMMEDIATELY TO ER FOR FEVER YOU ARE UNABLE TO GET DOWN WITH TYLENOL, BREATHING PROBLEMS, CHEST PAIN, FATIGUE, LETHARGY, INABILITY TO EAT OR DRINK, ETC  QUARANTINE AND ISOLATION: To help decrease the spread of COVID-19 please remain isolated  if you have COVID infection or are highly suspected to have COVID  infection. This means -stay home and isolate to one room in the home if you live with others. Do not share a bed or bathroom with others while ill, sanitize and wipe down all countertops and keep common areas clean and disinfected. Stay home for 5 days. If you have no symptoms or your symptoms are resolving after 5 days, you can leave your house. Continue to wear a mask around others for 5 additional days. If you have been in close contact (within 6 feet) of someone diagnosed with COVID 19, you are advised to quarantine in your home for 14 days as symptoms can develop anywhere from 2-14 days after exposure to the virus. If you develop symptoms, you  must isolate.  Most current guidelines for COVID after exposure -unvaccinated: isolate 5 days and strict mask use x 5 days. Test on day 5 is possible -vaccinated: wear mask x 10 days if symptoms do not develop -You do not necessarily need to be tested for COVID if you have + exposure and  develop symptoms. Just isolate at home x10 days from symptom onset During this global pandemic, CDC advises to practice social distancing, try to stay at least 19ft away from others at all times. Wear a face covering. Wash and sanitize your hands regularly and avoid going anywhere that is not necessary.  KEEP IN MIND THAT THE COVID TEST IS NOT 100% ACCURATE AND YOU SHOULD STILL DO EVERYTHING TO PREVENT POTENTIAL SPREAD OF VIRUS TO OTHERS (WEAR MASK, WEAR GLOVES, [place] HANDS AND SANITIZE REGULARLY). IF INITIAL TEST IS NEGATIVE, THIS MAY NOT MEAN YOU ARE DEFINITELY NEGATIVE. MOST ACCURATE TESTING IS DONE 5-7 DAYS AFTER EXPOSURE.   It is not advised by CDC to get re-tested after receiving a positive COVID test since you can still test positive for weeks to months after you have already cleared the virus.   *If you have not been vaccinated for COVID, I strongly suggest you consider getting vaccinated as long as  there are no contraindications.      ED Prescriptions    Medication Sig Dispense Auth. Provider   albuterol (VENTOLIN HFA) 108 (90 Base) MCG/ACT inhaler Inhale 1-2 puffs into the lungs every 6 (six) hours as needed for up to 10 days for wheezing or shortness of breath. 1 g Danton Clap, PA-C     PDMP not reviewed this encounter.   Danton Clap, PA-C 01/20/20 1414

## 2020-01-20 NOTE — Discharge Instructions (Signed)

## 2020-01-21 LAB — SARS CORONAVIRUS 2 (TAT 6-24 HRS): SARS Coronavirus 2: NEGATIVE

## 2020-07-28 ENCOUNTER — Other Ambulatory Visit: Payer: Self-pay

## 2020-07-28 ENCOUNTER — Emergency Department: Payer: Medicare HMO

## 2020-07-28 ENCOUNTER — Emergency Department
Admission: EM | Admit: 2020-07-28 | Discharge: 2020-07-29 | Disposition: A | Discharge status: Home / Self Care | Payer: Medicare HMO | Attending: Emergency Medicine | Admitting: Emergency Medicine

## 2020-07-28 DIAGNOSIS — R112 Nausea with vomiting, unspecified: Secondary | ICD-10-CM | POA: Insufficient documentation

## 2020-07-28 DIAGNOSIS — I10 Essential (primary) hypertension: Secondary | ICD-10-CM | POA: Insufficient documentation

## 2020-07-28 DIAGNOSIS — Z87891 Personal history of nicotine dependence: Secondary | ICD-10-CM | POA: Insufficient documentation

## 2020-07-28 DIAGNOSIS — R509 Fever, unspecified: Secondary | ICD-10-CM | POA: Diagnosis present

## 2020-07-28 DIAGNOSIS — Z79899 Other long term (current) drug therapy: Secondary | ICD-10-CM | POA: Diagnosis not present

## 2020-07-28 DIAGNOSIS — U071 COVID-19: Secondary | ICD-10-CM | POA: Diagnosis not present

## 2020-07-28 DIAGNOSIS — R110 Nausea: Secondary | ICD-10-CM

## 2020-07-28 DIAGNOSIS — R0602 Shortness of breath: Secondary | ICD-10-CM | POA: Diagnosis not present

## 2020-07-28 MED ORDER — ONDANSETRON HCL 4 MG/2ML IJ SOLN
4.0000 mg | Freq: Once | INTRAMUSCULAR | Status: AC
  Administered 2020-07-29: 4 mg via INTRAVENOUS
  Filled 2020-07-28: qty 2

## 2020-07-28 NOTE — ED Triage Notes (Signed)
Pt states was dx with Covid today by home test, pcp prescribed oral meds for the same. Pt unsure of name, states after taking first dose she has been vomiting and having epigastric pain that radiates up to chest.

## 2020-07-28 NOTE — ED Provider Notes (Addendum)
[facility] Emergency Department Provider Note  ____________________________________________   Event Date/Time   First MD Initiated Contact with Patient 07/28/20 2348     (approximate)  I have reviewed the triage vital signs and the nursing notes.   HISTORY  Chief Complaint Chest Pain and Vomiting    HPI Toni Mcgee is a 75 y.o. female  with h/o HTN here with nausea, vomiting. Pt reports that she started having fever, chills 2 days ago. Took a home COVID test and it was positive. She called her PCP and had a virtual appt, was started on Paxlovid. She took her first dose of Paxlovid this afternoon and shortly thereafter began vomiting, with nausea. Has vomited since then, has been unable to eat/drink. She was not having any nausea or abd prior to taking Paxlovid. Denies any CP, cough. No SOB. No other complaints. No specific alleviating or aggravating factors other than attempting to eat, which makes her nauseous.        Past Medical History:  Diagnosis Date   Hypertension     There are no problems to display for this patient.   Past Surgical History:  Procedure Laterality Date   ABDOMINAL HYSTERECTOMY     CHOLECYSTECTOMY     PARATHYROIDECTOMY      Prior to Admission medications   Medication Sig Start Date End Date Taking? Authorizing Provider  ondansetron (ZOFRAN ODT) 4 MG disintegrating tablet Take 1 tablet (4 mg total) by mouth every 8 (eight) hours as needed for nausea or vomiting. 07/29/20  Yes Shaune Pollack, MD  albuterol (VENTOLIN HFA) 108 (90 Base) MCG/ACT inhaler Inhale 1-2 puffs into the lungs every 6 (six) hours as needed for up to 10 days for wheezing or shortness of breath. 01/20/20 01/30/20  Shirlee Latch, PA-C  levothyroxine (SYNTHROID) 100 MCG tablet Take by mouth. 12/22/19 12/21/20  [provider]  metoprolol succinate (TOPROL-XL) 25 MG 24 hr tablet Take by mouth. 10/20/19 10/19/20  [provider]     Allergies Penicillins, Prednisone, Sulfa antibiotics, and Tylenol [acetaminophen]  Family History  Problem Relation Age of Onset   Breast cancer Neg Hx     Social History Social History   Tobacco Use   Smoking status: Former   Smokeless tobacco: Never  Building services engineer Use: Never used  Substance Use Topics   Alcohol use: Yes    Comment: occ   Drug use: Never    Review of Systems  Review of Systems  Constitutional:  Positive for chills, fatigue and fever.  HENT:  Negative for congestion and sore throat.   Eyes:  Negative for visual disturbance.  Respiratory:  Positive for cough. Negative for shortness of breath.   Cardiovascular:  Negative for chest pain.  Gastrointestinal:  Positive for abdominal pain, nausea and vomiting. Negative for diarrhea.  Genitourinary:  Negative for flank pain.  Musculoskeletal:  Negative for back pain and neck pain.  Skin:  Negative for rash and wound.  Neurological:  Negative for weakness.  All other systems reviewed and are negative.   ____________________________________________  PHYSICAL EXAM:      VITAL SIGNS: ED Triage Vitals  Enc Vitals Group     BP      Pulse      Resp      Temp      Temp src      SpO2      Weight      Height  Head Circumference      Peak Flow      Pain Score      Pain Loc      Pain Edu?      Excl. in GC?      Physical Exam Vitals and nursing note reviewed.  Constitutional:      General: She is not in acute distress.    Appearance: She is well-developed.  HENT:     Head: Normocephalic and atraumatic.  Eyes:     Conjunctiva/sclera: Conjunctivae normal.  Cardiovascular:     Rate and Rhythm: Normal rate and regular rhythm.     Heart sounds: Normal heart sounds. No murmur heard.   No friction rub.  Pulmonary:     Effort: Pulmonary effort is normal. No respiratory distress.     Breath sounds: Normal breath sounds. No wheezing or rales.  Abdominal:     General: There is no  distension.     Palpations: Abdomen is soft.     Tenderness: There is no abdominal tenderness.  Musculoskeletal:     Cervical back: Neck supple.  Skin:    General: Skin is warm.     Capillary Refill: Capillary refill takes less than 2 seconds.  Neurological:     Mental Status: She is alert and oriented to person, place, and time.     Motor: No abnormal muscle tone.      ____________________________________________   LABS (all labs ordered are listed, but only abnormal results are displayed)  Labs Reviewed  RESP PANEL BY RT-PCR (FLU A&B, COVID) ARPGX2 - Abnormal; Notable for the following components:      Result Value   SARS Coronavirus 2 by RT PCR POSITIVE (*)    All other components within normal limits  COMPREHENSIVE METABOLIC PANEL - Abnormal; Notable for the following components:   Glucose, Bld 131 (*)    Calcium 8.3 (*)    All other components within normal limits  LACTIC ACID, PLASMA - Abnormal; Notable for the following components:   Lactic Acid, Venous 2.0 (*)    All other components within normal limits  BRAIN NATRIURETIC PEPTIDE - Abnormal; Notable for the following components:   B Natriuretic Peptide 454.5 (*)    All other components within normal limits  CBC WITH DIFFERENTIAL/PLATELET  LIPASE, BLOOD  PROCALCITONIN  LACTIC ACID, PLASMA  TROPONIN I (HIGH SENSITIVITY)  TROPONIN I (HIGH SENSITIVITY)    ____________________________________________  EKG: Normal sinus rhythm, VR 80. PR 154, QRS 90, QTc 414. No acute St elevations. Frequent PVCs with bigeminy. No St elevations. ________________________________________  RADIOLOGY All imaging, including plain films, CT scans, and ultrasounds, independently reviewed by me, and interpretations confirmed via formal radiology reads.  ED MD interpretation:   CXR: Clear  Official radiology report(s): DG Chest Portable 1 View  Result Date: 07/29/2020 CLINICAL DATA:  Shortness of breath and COVID-19 positivity,  initial encounter EXAM: PORTABLE CHEST 1 VIEW COMPARISON:  01/20/2020 FINDINGS: Cardiac shadow is within normal limits. Lungs are well aerated bilaterally. No focal infiltrate or sizable effusion is seen. Prominent vessel on end is noted adjacent to the left hilum stable from the previous exam. No bony abnormality is seen. IMPRESSION: No acute abnormality noted. Electronically Signed   By: Alcide Clever M.D.   On: 07/29/2020 00:18    ____________________________________________  PROCEDURES   Procedure(s) performed (including Critical Care):  Procedures  ____________________________________________  INITIAL IMPRESSION / MDM / ASSESSMENT AND PLAN / ED COURSE  As part of my medical decision making,  I reviewed the following data within the electronic MEDICAL RECORD NUMBER Nursing notes reviewed and incorporated, Old chart reviewed, Notes from prior ED visits, and [place] Controlled Substance Database       *Shikha Bibb Harroun was evaluated in Emergency Department on 07/29/2020 for the symptoms described in the history of present illness. She was evaluated in the context of the global COVID-19 pandemic, which necessitated consideration that the patient might be at risk for infection with the SARS-CoV-2 virus that causes COVID-19. Institutional protocols and algorithms that pertain to the evaluation of patients at risk for COVID-19 are in a state of rapid change based on information released by regulatory bodies including the CDC and federal and state organizations. These policies and algorithms were followed during the patient's care in the ED.  Some ED evaluations and interventions may be delayed as a result of limited staffing during the pandemic.*     Medical Decision Making:  75 yo F here with nausea, vomiting in setting of COVID-19. Suspect her acute onset of n/v is more so secondary to taking Paxlovid. She has a h/o sensitivities to medications and her sx correlate directly with taking this. Pt has  otherwise had no abd pain and abd is soft, NT, ND. Lab work reviewed and is overall very reassuring. CBC without leukocytosis or anemia. CMP unremarkable. Trop neg x 2 and EKG is nonischemic, doubt ACS. LA 2, likely from mild dehydration, cleared with 1L NS. CXR is clear. Pt feels much better after receiving Zofran and is tolerating PO. Given her o/w stable, well appearance with no signs of resp compromise or ongoing n/v or intra-abd emergency, will continue supportive care for COVID-19 at home with Zofran PRN nausea. Discussed risks/benefits of Paxlovid, and given that this could have caused her n/v, she will hold at home.  Of note, BNP incidentally increased. H/o RVOT and sees Cardiology. Pt has no signs of CHF clinically, no leg swelling, no edema. No CP, pleurisy, tachycardia, or signs of PE. ____________________________________________  FINAL CLINICAL IMPRESSION(S) / ED DIAGNOSES  Final diagnoses:  COVID-19  Nausea     MEDICATIONS GIVEN DURING THIS VISIT:  Medications  ondansetron (ZOFRAN) injection 4 mg (4 mg Intravenous Given 07/29/20 0000)  sodium chloride 0.9 % bolus 1,000 mL (0 mLs Intravenous Stopped 07/29/20 0210)  alum & mag hydroxide-simeth (MAALOX/MYLANTA) 200-200-20 MG/5ML suspension 30 mL (30 mLs Oral Given 07/29/20 0036)    And  lidocaine (XYLOCAINE) 2 % viscous mouth solution 15 mL (15 mLs Oral Given 07/29/20 0036)     ED Discharge Orders          Ordered    ondansetron (ZOFRAN ODT) 4 MG disintegrating tablet  Every 8 hours PRN        07/29/20 0257             Note:  This document was prepared using Dragon voice recognition software and may include unintentional dictation errors.   Shaune Pollack, MD 07/29/20 6553    Shaune Pollack, MD 07/29/20 [phone]

## 2020-07-29 LAB — CBC WITH DIFFERENTIAL/PLATELET
Abs Immature Granulocytes: 0.02 10*3/uL (ref 0.00–0.07)
Basophils Absolute: 0 10*3/uL (ref 0.0–0.1)
Basophils Relative: 0 %
Eosinophils Absolute: 0 10*3/uL (ref 0.0–0.5)
Eosinophils Relative: 0 %
HCT: 40.7 % (ref 36.0–46.0)
Hemoglobin: 13.6 g/dL (ref 12.0–15.0)
Immature Granulocytes: 0 %
Lymphocytes Relative: 14 %
Lymphs Abs: 0.7 10*3/uL (ref 0.7–4.0)
MCH: 30.1 pg (ref 26.0–34.0)
MCHC: 33.4 g/dL (ref 30.0–36.0)
MCV: 90 fL (ref 80.0–100.0)
Monocytes Absolute: 0.4 10*3/uL (ref 0.1–1.0)
Monocytes Relative: 8 %
Neutro Abs: 3.7 10*3/uL (ref 1.7–7.7)
Neutrophils Relative %: 78 %
Platelets: 225 10*3/uL (ref 150–400)
RBC: 4.52 MIL/uL (ref 3.87–5.11)
RDW: 14 % (ref 11.5–15.5)
WBC: 4.8 10*3/uL (ref 4.0–10.5)
nRBC: 0 % (ref 0.0–0.2)

## 2020-07-29 LAB — COMPREHENSIVE METABOLIC PANEL
ALT: 39 U/L (ref 0–44)
AST: 36 U/L (ref 15–41)
Albumin: 3.7 g/dL (ref 3.5–5.0)
Alkaline Phosphatase: 41 U/L (ref 38–126)
Anion gap: 10 (ref 5–15)
BUN: 12 mg/dL (ref 8–23)
CO2: 22 mmol/L (ref 22–32)
Calcium: 8.3 mg/dL — ABNORMAL LOW (ref 8.9–10.3)
Chloride: 103 mmol/L (ref 98–111)
Creatinine, Ser: 0.66 mg/dL (ref 0.44–1.00)
GFR, Estimated: >60 mL/min (ref >60)
Glucose, Bld: 131 mg/dL — ABNORMAL HIGH (ref 70–99)
Potassium: 3.6 mmol/L (ref 3.5–5.1)
Sodium: 135 mmol/L (ref 135–145)
Total Bilirubin: 0.7 mg/dL (ref 0.3–1.2)
Total Protein: 7.5 g/dL (ref 6.5–8.1)

## 2020-07-29 LAB — LIPASE, BLOOD: Lipase: 30 U/L (ref 11–51)

## 2020-07-29 LAB — TROPONIN I (HIGH SENSITIVITY)
Troponin I (High Sensitivity): 10 ng/L (ref <18)
Troponin I (High Sensitivity): 9 ng/L (ref <18)

## 2020-07-29 LAB — PROCALCITONIN: Procalcitonin: <0.1 ng/mL

## 2020-07-29 LAB — RESP PANEL BY RT-PCR (FLU A&B, COVID) ARPGX2
Influenza A by PCR: NEGATIVE
Influenza B by PCR: NEGATIVE
SARS Coronavirus 2 by RT PCR: POSITIVE — AB

## 2020-07-29 LAB — LACTIC ACID, PLASMA
Lactic Acid, Venous: 1.4 mmol/L (ref 0.5–1.9)
Lactic Acid, Venous: 2 mmol/L (ref 0.5–1.9)

## 2020-07-29 LAB — BRAIN NATRIURETIC PEPTIDE: B Natriuretic Peptide: 454.5 pg/mL — ABNORMAL HIGH (ref 0.0–100.0)

## 2020-07-29 MED ORDER — ONDANSETRON 4 MG PO TBDP: 4.0000 mg | ORAL_TABLET | Freq: Three times a day (TID) | ORAL | 0 refills | Status: DC | PRN

## 2020-07-29 MED ORDER — ALUM & MAG HYDROXIDE-SIMETH 200-200-20 MG/5ML PO SUSP
30.0000 mL | Freq: Once | ORAL | Status: AC
  Administered 2020-07-29: 30 mL via ORAL
  Filled 2020-07-29: qty 30

## 2020-07-29 MED ORDER — LIDOCAINE VISCOUS HCL 2 % MT SOLN
15.0000 mL | Freq: Once | OROMUCOSAL | Status: AC
  Administered 2020-07-29: 15 mL via ORAL
  Filled 2020-07-29: qty 15

## 2020-07-29 MED ORDER — SODIUM CHLORIDE 0.9 % IV BOLUS
1000.0000 mL | Freq: Once | INTRAVENOUS | Status: AC
  Administered 2020-07-29: 1000 mL via INTRAVENOUS

## 2020-07-29 NOTE — Discharge Instructions (Addendum)
I would STOP taking the Paxlovid  Continue aspirin for fever. You can also take Ibuprofen, Naproxen (Alleve) for fever if you are able to tolerate it.  Take the [facility] for nausea, as needed.  Drink plenty of fluid  Follow-up with your primary doctor in 2 days

## 2020-07-29 NOTE — ED Notes (Signed)
PO challenge initiated at this time.

## 2020-07-29 NOTE — ED Notes (Signed)
Daughter called to Archivist that she is arranging transport for pts discharge. Writer to receive callback with up date and ETA.

## 2020-07-29 NOTE — ED Notes (Signed)
Pt up to bathroom and back into bed.

## 2020-10-21 ENCOUNTER — Other Ambulatory Visit: Payer: Self-pay | Admitting: Family Medicine

## 2020-10-21 DIAGNOSIS — Z1231 Encounter for screening mammogram for malignant neoplasm of breast: Secondary | ICD-10-CM

## 2020-11-29 ENCOUNTER — Other Ambulatory Visit: Payer: Self-pay

## 2020-11-29 ENCOUNTER — Ambulatory Visit
Admission: RE | Admit: 2020-11-29 | Discharge: 2020-11-29 | Disposition: A | Discharge status: Home / Self Care | Payer: Medicare HMO | Source: Ambulatory Visit | Attending: Family Medicine | Admitting: Family Medicine

## 2020-11-29 DIAGNOSIS — Z1231 Encounter for screening mammogram for malignant neoplasm of breast: Secondary | ICD-10-CM | POA: Diagnosis not present

## 2020-12-31 LAB — EXTERNAL GENERIC LAB PROCEDURE: COLOGUARD: NEGATIVE

## 2020-12-31 LAB — COLOGUARD: COLOGUARD: NEGATIVE

## 2021-01-07 ENCOUNTER — Ambulatory Visit (INDEPENDENT_AMBULATORY_CARE_PROVIDER_SITE_OTHER): Payer: Medicare HMO

## 2021-01-07 ENCOUNTER — Encounter: Payer: Self-pay | Admitting: Medical Oncology

## 2021-01-07 ENCOUNTER — Ambulatory Visit
Admission: EM | Admit: 2021-01-07 | Discharge: 2021-01-07 | Disposition: A | Discharge status: Home / Self Care | Payer: Medicare HMO | Attending: Medical Oncology | Admitting: Medical Oncology

## 2021-01-07 ENCOUNTER — Other Ambulatory Visit: Payer: Self-pay

## 2021-01-07 DIAGNOSIS — R0981 Nasal congestion: Secondary | ICD-10-CM | POA: Diagnosis not present

## 2021-01-07 DIAGNOSIS — J101 Influenza due to other identified influenza virus with other respiratory manifestations: Secondary | ICD-10-CM

## 2021-01-07 DIAGNOSIS — R051 Acute cough: Secondary | ICD-10-CM | POA: Diagnosis not present

## 2021-01-07 DIAGNOSIS — R059 Cough, unspecified: Secondary | ICD-10-CM

## 2021-01-07 LAB — POCT INFLUENZA A/B
Influenza A, POC: POSITIVE — AB
Influenza B, POC: NEGATIVE

## 2021-01-07 MED ORDER — BENZONATATE 100 MG PO CAPS: 100.0000 mg | ORAL_CAPSULE | Freq: Three times a day (TID) | ORAL | 0 refills | Status: DC

## 2021-01-07 MED ORDER — OSELTAMIVIR PHOSPHATE 75 MG PO CAPS: 75.0000 mg | ORAL_CAPSULE | Freq: Two times a day (BID) | ORAL | 0 refills | Status: DC

## 2021-01-07 NOTE — ED Triage Notes (Signed)
Pt here with flu-like sx with fever x 2 days.  °

## 2021-01-07 NOTE — ED Provider Notes (Signed)
Toni Mcgee    CSN: [id number] Arrival date & time: 01/07/21  1408      History   Chief Complaint Chief Complaint  Patient presents with   Cough   Fever   Nasal Congestion    HPI Toni Mcgee is a 75 y.o. female.   HPI  Cough: Patient reports that they have had symptoms of dry cough, fever, nasal congestion for the past 2 days. Symptoms are stable. They deny SOB, chest pain, fever or vomiting. They have tried aspirin for symptoms. No known sick contacts.    Past Medical History:  Diagnosis Date   Hypertension     There are no problems to display for this patient.   Past Surgical History:  Procedure Laterality Date   ABDOMINAL HYSTERECTOMY     CHOLECYSTECTOMY     PARATHYROIDECTOMY      OB History   No obstetric history on file.      Home Medications    Prior to Admission medications   Medication Sig Start Date End Date Taking? Authorizing Provider  benzonatate (TESSALON) 100 MG capsule Take 1 capsule (100 mg total) by mouth every 8 (eight) hours. 01/07/21  Yes Haaris Metallo, Maralyn Sago M, PA-C  oseltamivir (TAMIFLU) 75 MG capsule Take 1 capsule (75 mg total) by mouth every 12 (twelve) hours. 01/07/21  Yes Kaegan Hettich M, PA-C  albuterol (VENTOLIN HFA) 108 (90 Base) MCG/ACT inhaler Inhale 1-2 puffs into the lungs every 6 (six) hours as needed for up to 10 days for wheezing or shortness of breath. 01/20/20 01/30/20  Shirlee Latch, PA-C  levothyroxine (SYNTHROID) 100 MCG tablet Take by mouth. 12/22/19 12/21/20  [provider]  metoprolol succinate (TOPROL-XL) 25 MG 24 hr tablet Take by mouth. 10/20/19 10/19/20  [provider]  ondansetron (ZOFRAN ODT) 4 MG disintegrating tablet Take 1 tablet (4 mg total) by mouth every 8 (eight) hours as needed for nausea or vomiting. 07/29/20   Shaune Pollack, MD    Family History Family History  Problem Relation Age of Onset   Breast cancer Neg Hx     Social History Social History   Tobacco  Use   Smoking status: Former   Smokeless tobacco: Never  Building services engineer Use: Never used  Substance Use Topics   Alcohol use: Yes    Comment: occ   Drug use: Never     Allergies   Penicillins, Prednisone, Sulfa antibiotics, and Tylenol [acetaminophen]   Review of Systems Review of Systems  As stated above in HPI Physical Exam Triage Vital Signs ED Triage Vitals [01/07/21 1600]  Enc Vitals Group     BP 128/65     Pulse Rate 90     Resp (!) 22     Temp 99.7 F (37.6 C)     Temp Source Oral     SpO2 94 %     Weight      Height      Head Circumference      Peak Flow      Pain Score      Pain Loc      Pain Edu?      Excl. in GC?    No data found.  Updated Vital Signs BP 128/65 (BP Location: Left Arm)    Pulse 90    Temp 99.7 F (37.6 C) (Oral)    Resp (!) 22    SpO2 94%   Physical Exam Vitals and nursing note reviewed.  Constitutional:  General: She is not in acute distress.    Appearance: Normal appearance. She is not ill-appearing, toxic-appearing or diaphoretic.  HENT:     Head: Normocephalic and atraumatic.     Right Ear: Tympanic membrane normal.     Left Ear: Tympanic membrane normal.     Nose: Congestion and rhinorrhea present.     Mouth/Throat:     Mouth: Mucous membranes are moist.     Pharynx: Oropharynx is clear. No oropharyngeal exudate or posterior oropharyngeal erythema.  Eyes:     General:        Right eye: No discharge.        Left eye: No discharge.     Extraocular Movements: Extraocular movements intact.     Conjunctiva/sclera: Conjunctivae normal.     Pupils: Pupils are equal, round, and reactive to light.  Cardiovascular:     Rate and Rhythm: Normal rate and regular rhythm.     Heart sounds: Normal heart sounds.  Pulmonary:     Effort: Pulmonary effort is normal.     Breath sounds: Normal breath sounds.  Musculoskeletal:     Cervical back: Normal range of motion and neck supple.  Lymphadenopathy:     Cervical: No  cervical adenopathy.  Skin:    General: Skin is warm.  Neurological:     Mental Status: She is alert and oriented to person, place, and time.     UC Treatments / Results  Labs (all labs ordered are listed, but only abnormal results are displayed) Labs Reviewed - No data to display  EKG   Radiology No results found.  Procedures Procedures (including critical care time)  Medications Ordered in UC Medications - No data to display  Initial Impression / Assessment and Plan / UC Course  I have reviewed the triage vital signs and the nursing notes.  Pertinent labs & imaging results that were available during my care of the patient were reviewed by me and considered in my medical decision making (see chart for details).     New. Positive for Influenza A. Treating with Tamiflu, tessalon and flonase. Given her age, O2 saturation, fever and cough with some SOB a chest x ray was obtained to ensure no sign of subsequent pneumonia. This x ray was negative for acute abnormality.    Final Clinical Impressions(s) / UC Diagnoses   Final diagnoses:  Influenza A  Acute cough  Nasal congestion   Discharge Instructions   None    ED Prescriptions     Medication Sig Dispense Auth. Provider   oseltamivir (TAMIFLU) 75 MG capsule Take 1 capsule (75 mg total) by mouth every 12 (twelve) hours. 10 capsule Jakoby Melendrez M, PA-C   benzonatate (TESSALON) 100 MG capsule Take 1 capsule (100 mg total) by mouth every 8 (eight) hours. 21 capsule Rushie Chestnut, [place]      PDMP not reviewed this encounter.   Rushie Chestnut, [place] 01/07/21 1637

## 2021-02-27 IMAGING — MR MR HEAD W/O CM
11 series · 41 of 48 positions shown · non-contrast
Comparison: None.

CLINICAL DATA: New onset dizziness and ataxia

EXAM:
MRI HEAD WITHOUT CONTRAST
TECHNIQUE: Multiplanar, multiecho pulse sequences of the brain and surrounding
structures were obtained without intravenous contrast.

[Series 5: ax dwi_tracew · axial · 3.0mm · 0.60mm/px · z∈[-92,+63]mm · 7 of 96 slices shown]
[im 1/96]
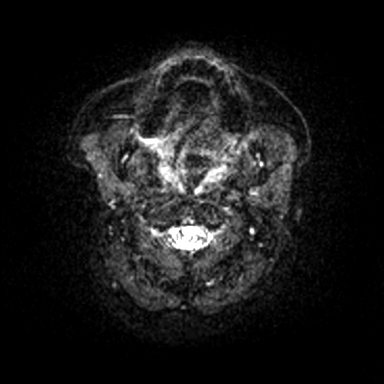
[im 16/96]
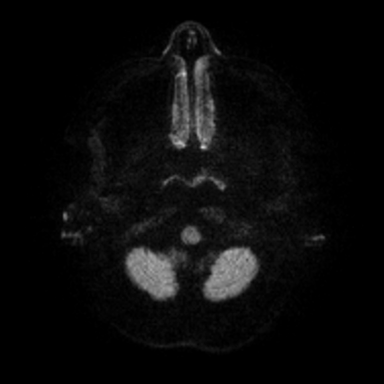
[im 32/96]
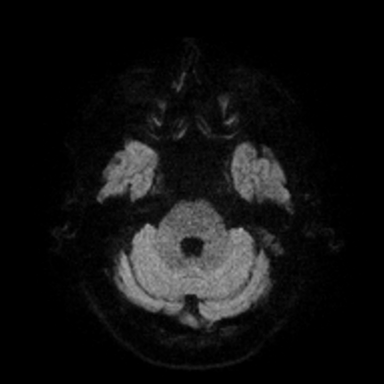
[im 48/96]
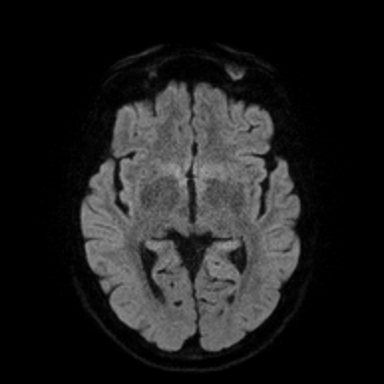
[im 64/96]
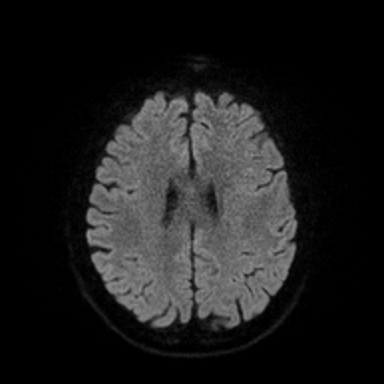
[im 80/96]
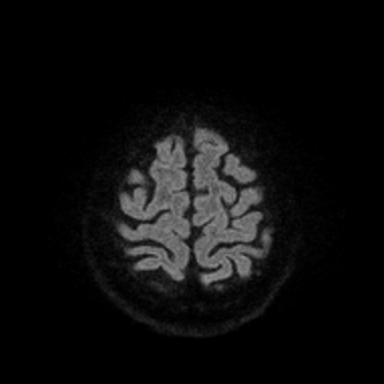
[im 96/96]
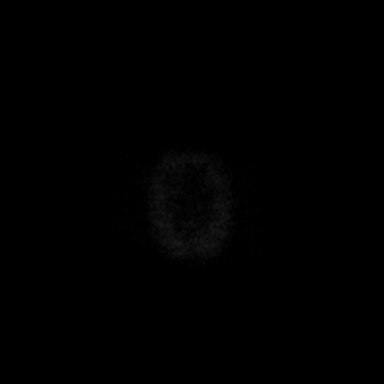

[Series 6: ax dwi_adc · axial · 3.0mm · 0.60mm/px · z∈[-92,+63]mm · 3 of 48 slices shown]
[im 1/48]
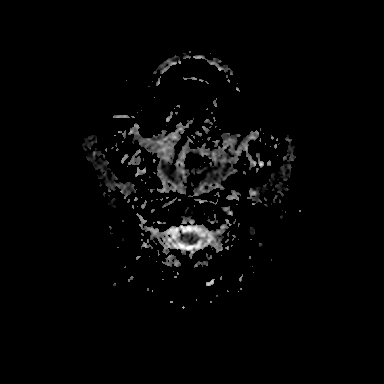
[im 24/48]
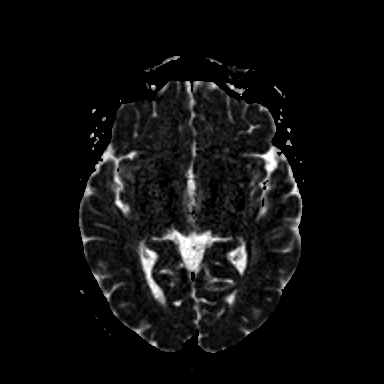
[im 48/48]
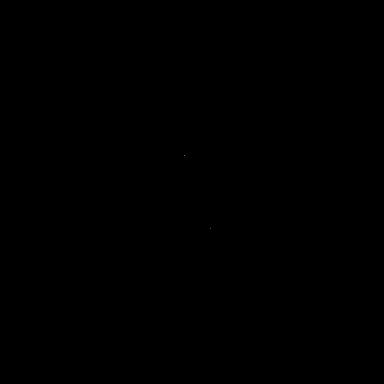

[Series 7: cor dwi_tracew · coronal · 5.0mm · 0.60mm/px · 6 of 80 slices shown]
[im 1/80]
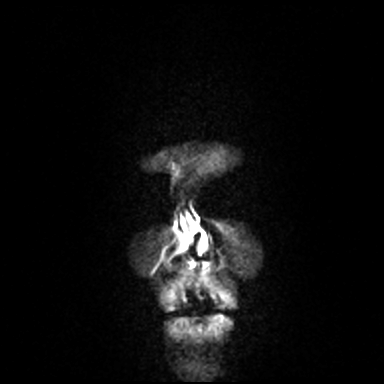
[im 16/80]
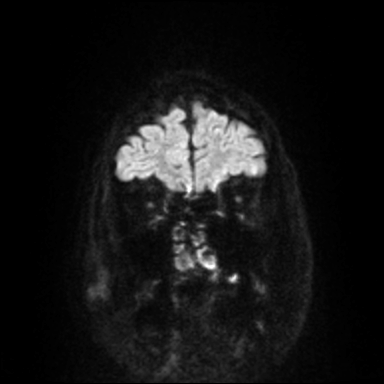
[im 32/80]
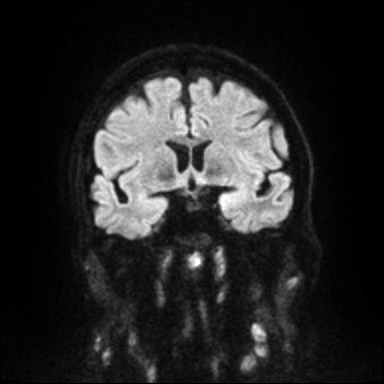
[im 48/80]
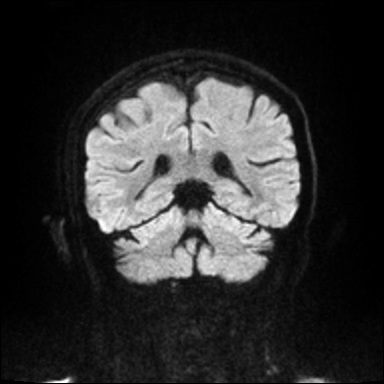
[im 64/80]
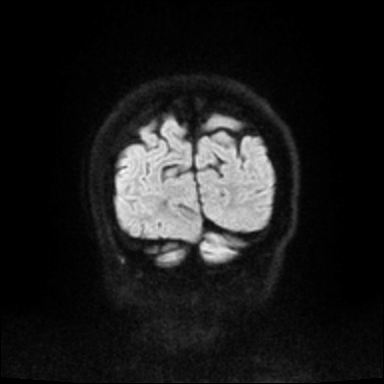
[im 80/80]
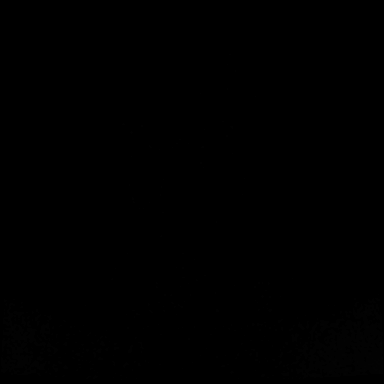

[Series 8: cor dwi_adc · coronal · 5.0mm · 0.60mm/px · 2 of 34 slices shown]
[im 1/34]
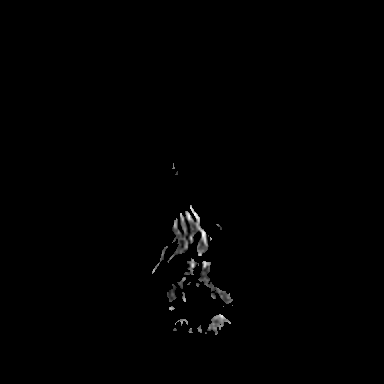
[im 34/34]
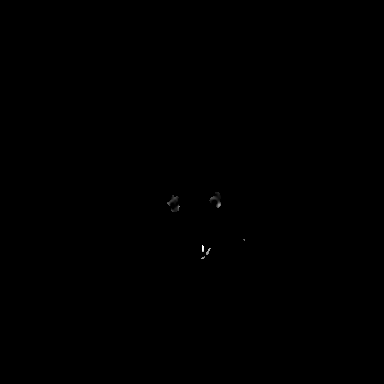

[Series 9: T1 · sagittal · 5.0mm · 0.62mm/px · 2 of 23 slices shown (1 of 2)]
[im 1/23]
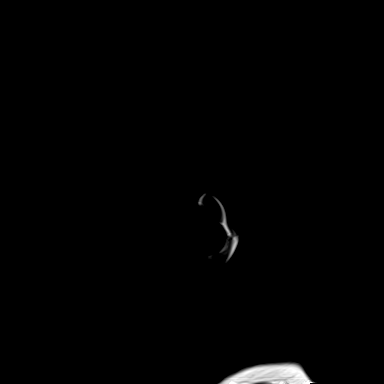
[im 23/23]
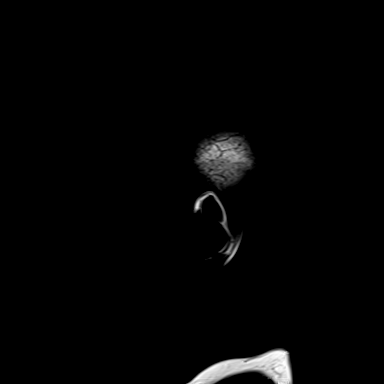

[Series 10: T2 · axial · 5.0mm · 0.53mm/px · z∈[-86,+58]mm · 2 of 25 slices shown (1 of 2)]
[im 1/25]
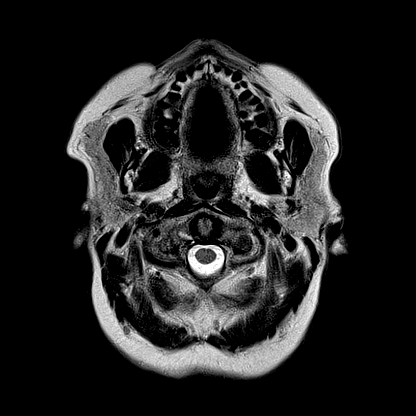
[im 25/25]
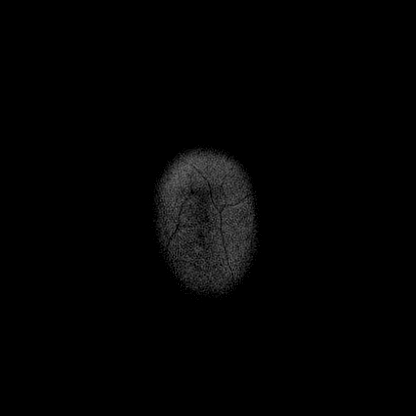

[Series 12: pha_images · axial · 3.0mm · 0.90mm/px · z∈[-100,+68]mm · 4 of 56 slices shown]
[im 1/56]
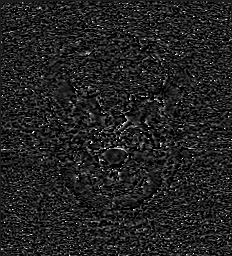
[im 19/56]
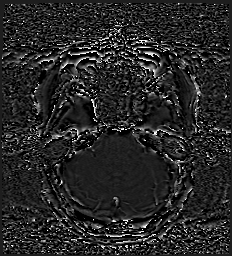
[im 37/56]
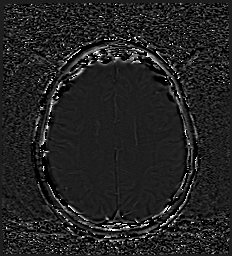
[im 56/56]
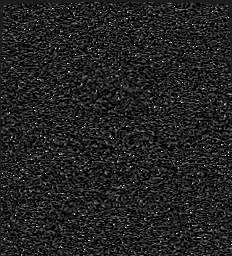

[Series 13: swi_images · axial · 3.0mm · 0.90mm/px · 1 of 60 slices shown]
[im 1/60]
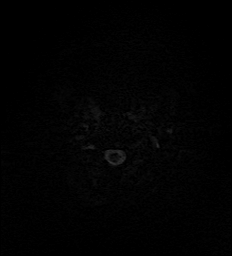

[Series 15: FLAIR · axial · 3.0mm · 0.53mm/px · z∈[-95,+67]mm · 4 of 55 slices shown]
[im 1/55]
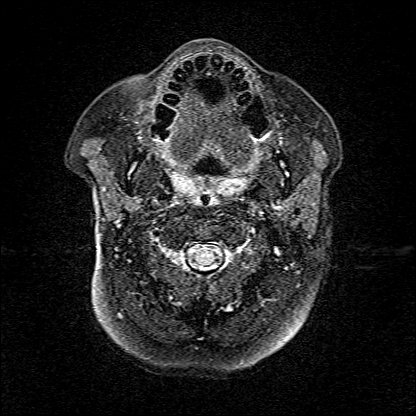
[im 19/55]
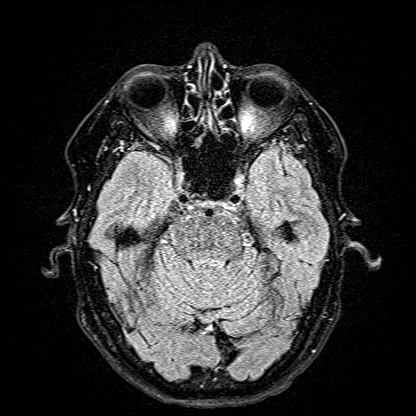
[im 37/55]
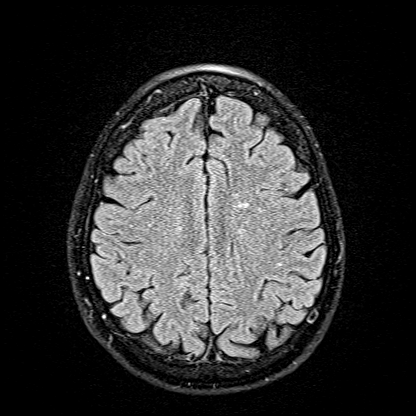
[im 55/55]
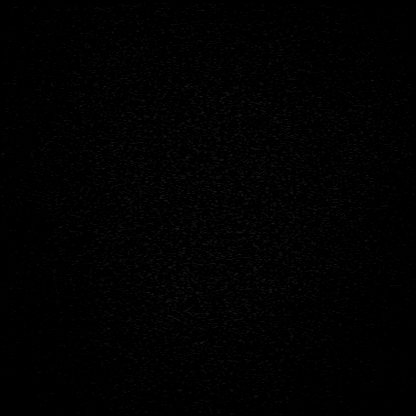

[Series 16: T1 · axial · 1.0mm · 0.98mm/px · z∈[-102,+72]mm · 8 of 175 slices shown (2 of 2)]
[im 1/175]
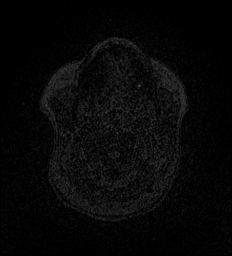
[im 32/175]
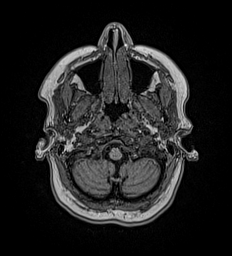
[im 48/175]
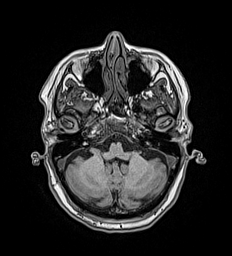
[im 80/175]
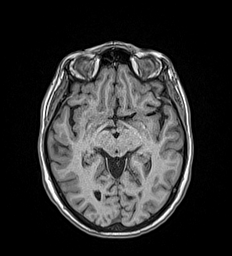
[im 95/175]
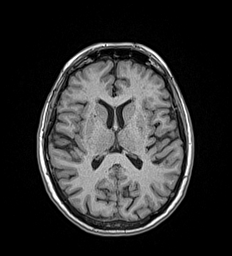
[im 127/175]
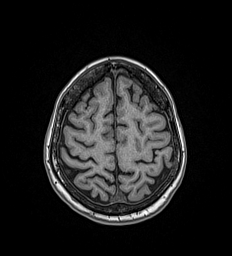
[im 143/175]
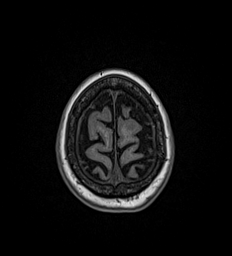
[im 175/175]
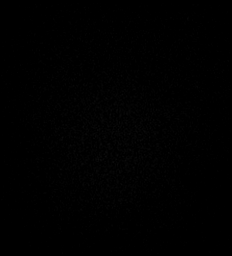

[Series 17: T2 · coronal · 5.0mm · 0.57mm/px · 2 of 29 slices shown (2 of 2)]
[im 1/29]
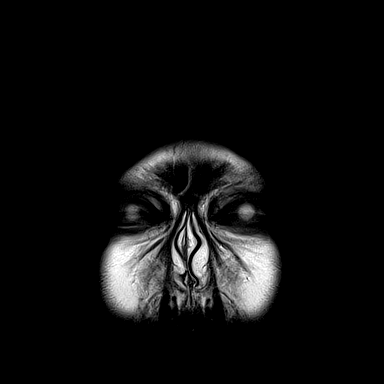
[im 29/29]
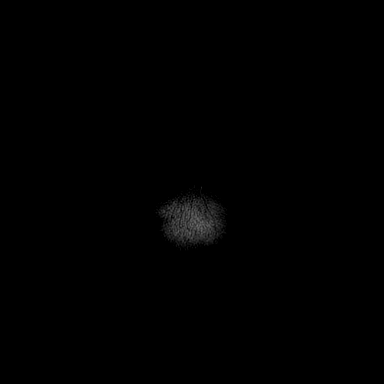

[41 of 48 positions shown; findings below may reference images not displayed]

FINDINGS: Brain: No acute infarction, hemorrhage, hydrocephalus, extra-axial
collection or mass lesion. Few tiny remote insults in the cerebral
white matter, less than commonly seen for age. Brain volume is
normal.

Vascular: Normal flow voids

Skull and upper cervical spine: Degenerative spurring in the
visualized spine. There is C2-3 facet ankylosis. No focal lesion
seen.

Sinuses/Orbits: Unremarkable. No mastoid or middle ear
opacification.
IMPRESSION: Unremarkable brain MRI.  No cerebellar infarct.

## 2021-04-28 ENCOUNTER — Encounter: Payer: Self-pay | Admitting: Ophthalmology

## 2021-05-02 NOTE — Discharge Instructions (Signed)

## 2021-05-04 ENCOUNTER — Ambulatory Visit: Payer: Medicare HMO | Admitting: Anesthesiology

## 2021-05-04 ENCOUNTER — Encounter
Admission: RE | Disposition: A | Discharge status: Home / Self Care | Payer: Self-pay | Source: Home / Self Care | Attending: Ophthalmology

## 2021-05-04 ENCOUNTER — Encounter: Payer: Self-pay | Admitting: Ophthalmology

## 2021-05-04 ENCOUNTER — Other Ambulatory Visit: Payer: Self-pay

## 2021-05-04 ENCOUNTER — Ambulatory Visit
Admission: RE | Admit: 2021-05-04 | Discharge: 2021-05-04 | Disposition: A | Discharge status: Home / Self Care | Payer: Medicare HMO | Attending: Ophthalmology | Admitting: Ophthalmology

## 2021-05-04 DIAGNOSIS — I10 Essential (primary) hypertension: Secondary | ICD-10-CM | POA: Insufficient documentation

## 2021-05-04 DIAGNOSIS — H2512 Age-related nuclear cataract, left eye: Secondary | ICD-10-CM | POA: Insufficient documentation

## 2021-05-04 DIAGNOSIS — E039 Hypothyroidism, unspecified: Secondary | ICD-10-CM | POA: Diagnosis not present

## 2021-05-04 DIAGNOSIS — Z87891 Personal history of nicotine dependence: Secondary | ICD-10-CM | POA: Diagnosis not present

## 2021-05-04 HISTORY — DX: Sciatica, unspecified side: M54.30

## 2021-05-04 HISTORY — DX: Other complications of anesthesia, initial encounter: T88.59XA

## 2021-05-04 HISTORY — DX: Hypothyroidism, unspecified: E03.9

## 2021-05-04 HISTORY — DX: Family history of other specified conditions: Z84.89

## 2021-05-04 HISTORY — DX: Presence of dental prosthetic device (complete) (partial): Z97.2

## 2021-05-04 HISTORY — PX: CATARACT EXTRACTION W/PHACO: SHX586

## 2021-05-04 SURGERY — PHACOEMULSIFICATION, CATARACT, WITH IOL INSERTION
Anesthesia: Monitor Anesthesia Care | Site: Eye | Laterality: Left

## 2021-05-04 MED ORDER — MOXIFLOXACIN HCL 0.5 % OP SOLN
OPHTHALMIC | Status: DC | PRN
  Administered 2021-05-04: 0.2 mL via OPHTHALMIC

## 2021-05-04 MED ORDER — SIGHTPATH DOSE#1 BSS IO SOLN
INTRAOCULAR | Status: DC | PRN
  Administered 2021-05-04: 66 mL via OPHTHALMIC

## 2021-05-04 MED ORDER — BRIMONIDINE TARTRATE-TIMOLOL 0.2-0.5 % OP SOLN
OPHTHALMIC | Status: DC | PRN
Start: 2021-05-04 — End: 2021-05-04
  Administered 2021-05-04: 1 [drp] via OPHTHALMIC

## 2021-05-04 MED ORDER — SIGHTPATH DOSE#1 BSS IO SOLN
INTRAOCULAR | Status: DC | PRN
  Administered 2021-05-04: 1 mL via INTRAMUSCULAR

## 2021-05-04 MED ORDER — TETRACAINE HCL 0.5 % OP SOLN
1.0000 [drp] | OPHTHALMIC | Status: DC | PRN
  Administered 2021-05-04 (×3): 1 [drp] via OPHTHALMIC

## 2021-05-04 MED ORDER — LACTATED RINGERS IV SOLN: INTRAVENOUS | Status: DC

## 2021-05-04 MED ORDER — MIDAZOLAM HCL 2 MG/2ML IJ SOLN
INTRAMUSCULAR | Status: DC | PRN
Start: 2021-05-04 — End: 2021-05-04
  Administered 2021-05-04: 1 mg via INTRAVENOUS

## 2021-05-04 MED ORDER — ARMC OPHTHALMIC DILATING DROPS
1.0000 "application " | OPHTHALMIC | Status: DC | PRN
  Administered 2021-05-04 (×3): 1 via OPHTHALMIC

## 2021-05-04 MED ORDER — FENTANYL CITRATE (PF) 100 MCG/2ML IJ SOLN
INTRAMUSCULAR | Status: DC | PRN
Start: 2021-05-04 — End: 2021-05-04
  Administered 2021-05-04: 50 ug via INTRAVENOUS

## 2021-05-04 MED ORDER — SIGHTPATH DOSE#1 BSS IO SOLN
INTRAOCULAR | Status: DC | PRN
  Administered 2021-05-04 (×2): 15 mL

## 2021-05-04 MED ORDER — SIGHTPATH DOSE#1 NA HYALUR & NA CHOND-NA HYALUR IO KIT
PACK | INTRAOCULAR | Status: DC | PRN
  Administered 2021-05-04: 1 via OPHTHALMIC

## 2021-05-04 SURGICAL SUPPLY — 15 items
CATARACT SUITE SIGHTPATH (MISCELLANEOUS) ×2 IMPLANT
CATH IV 18X1 1/4 SAFELET (CATHETERS) ×1 IMPLANT
FEE CATARACT SUITE SIGHTPATH (MISCELLANEOUS) ×1 IMPLANT
GLOVE SRG 8 PF TXTR STRL LF DI (GLOVE) ×1 IMPLANT
GLOVE SURG ENC TEXT LTX SZ7.5 (GLOVE) ×2 IMPLANT
GLOVE SURG UNDER POLY LF SZ8 (GLOVE) ×2
IV CATH 18X1 1/4 SAFELET (CATHETERS) ×1
LENS IOL EYHANCE TORIC II 23.0 ×2 IMPLANT
LENS IOL EYHANCE TRC 150 23.0 IMPLANT
LENS IOL EYHNC TORIC 150 23.0 ×1 IMPLANT
NDL FILTER BLUNT 18X1 1/2 (NEEDLE) ×1 IMPLANT
NEEDLE FILTER BLUNT 18X 1/2SAF (NEEDLE) ×1
NEEDLE FILTER BLUNT 18X1 1/2 (NEEDLE) ×1 IMPLANT
SYR 3ML LL SCALE MARK (SYRINGE) ×2 IMPLANT
WATER STERILE IRR 250ML POUR (IV SOLUTION) ×2 IMPLANT

## 2021-05-04 NOTE — Anesthesia Postprocedure Evaluation (Signed)
Anesthesia Post Note ? ?Patient: Toni Mcgee ? ?Procedure(s) Performed: CATARACT EXTRACTION PHACO AND INTRAOCULAR LENS PLACEMENT (IOC) LEFT eyhance toric lens (Left: Eye) ? ? ?  ?Patient location during evaluation: PACU ?Anesthesia Type: MAC ?Level of consciousness: awake and alert and oriented ?Pain management: satisfactory to patient ?Vital Signs Assessment: post-procedure vital signs reviewed and stable ?Respiratory status: spontaneous breathing, nonlabored ventilation and respiratory function stable ?Cardiovascular status: blood pressure returned to baseline and stable ?Postop Assessment: Adequate PO intake and No signs of nausea or vomiting ?Anesthetic complications: no ? ? ?No notable events documented. ? ?Raliegh Ip ? ? ? ? ? ?

## 2021-05-04 NOTE — H&P (Signed)
?Toni Mcgee  ? ?Primary Care Physician:  Gayland Curry, MD ?Ophthalmologist: Dr. Leandrew Koyanagi ? ?Pre-Procedure History & Physical: ?HPI:  Toni Mcgee is a 76 y.o. female here for ophthalmic surgery. ?  ?Past Medical History:  ?Diagnosis Date  ? Complication of anesthesia   ? During D&C in 1980s "died on the table" and was resusitated  ? Dental bridge present   ? permanent.  lower. both sided  ? Family history of adverse reaction to anesthesia   ? Duaghter - makes "Fidgety"  ? Hypertension   ? Hypothyroidism   ? Sciatica   ? right  ? ? ?Past Surgical History:  ?Procedure Laterality Date  ? ABDOMINAL HYSTERECTOMY    ? CHOLECYSTECTOMY    ? PARATHYROIDECTOMY    ? ? ?Prior to Admission medications   ?Medication Sig Start Date End Date Taking? Authorizing Provider  ?chlorpheniramine (CHLOR-TRIMETON) 4 MG tablet Take 4 mg by mouth 2 (two) times daily as needed for allergies.   Yes [provider]  ?levothyroxine (SYNTHROID) 100 MCG tablet Take by mouth. 12/22/19 05/04/21 Yes [provider]  ?metoprolol succinate (TOPROL-XL) 25 MG 24 hr tablet Take by mouth. 10/20/19 05/04/21 Yes [provider]  ?Multiple Vitamin (MULTIVITAMIN) tablet Take 1 tablet by mouth daily.   Yes [provider]  ?naproxen sodium (ALEVE) 220 MG tablet Take 220 mg by mouth as needed.   Yes [provider]  ?Omega-3 Fatty Acids (FISH OIL) 1200 MG CAPS Take by mouth daily.   Yes [provider]  ?albuterol (VENTOLIN HFA) 108 (90 Base) MCG/ACT inhaler Inhale 1-2 puffs into the lungs every 6 (six) hours as needed for up to 10 days for wheezing or shortness of breath. 01/20/20 01/30/20  Danton Clap, PA-C  ?Boswellia-Glucosamine-Vit D (OSTEO BI-FLEX ONE PER DAY PO) Take by mouth daily.    [provider]  ? ? ?Allergies as of 03/24/2021 - Review Complete 01/07/2021  ?Allergen Reaction Noted  ? Penicillins  07/13/2017  ? Prednisone  07/28/2020  ? Sulfa antibiotics   07/13/2017  ? Tylenol [acetaminophen]  07/13/2017  ? ? ?Family History  ?Problem Relation Age of Onset  ? Breast cancer Neg Hx   ? ? ?Social History  ? ?Socioeconomic History  ? Marital status: Widowed  ?  Spouse name: Not on file  ? Number of children: Not on file  ? Years of education: Not on file  ? Highest education level: Not on file  ?Occupational History  ? Not on file  ?Tobacco Use  ? Smoking status: Former  ?  Types: Cigarettes  ?  Quit date: 1968  ?  Years since quitting: 55.3  ? Smokeless tobacco: Never  ?Vaping Use  ? Vaping Use: Never used  ?Substance and Sexual Activity  ? Alcohol use: Yes  ?  Comment: rare  ? Drug use: Never  ? Sexual activity: Not on file  ?Other Topics Concern  ? Not on file  ?Social History Narrative  ? Not on file  ? ?Social Determinants of Health  ? ?Financial Resource Strain: Not on file  ?Food Insecurity: Not on file  ?Transportation Needs: Not on file  ?Physical Activity: Not on file  ?Stress: Not on file  ?Social Connections: Not on file  ?Intimate Partner Violence: Not on file  ? ? ?Review of Systems: ?See HPI, otherwise negative ROS ? ?Physical Exam: ?BP (!) 160/93   Pulse 66   Temp (!) 97 ?F (36.1 ?C) (Temporal)   Ht  5\' 1"  (1.549 m)   Wt 90.6 kg   SpO2 95%   BMI 37.75 kg/m?  ?General:   Alert,  pleasant and cooperative in NAD ?Head:  Normocephalic and atraumatic. ?Lungs:  Clear to auscultation.    ?Heart:  Regular rate and rhythm.  ? ?Impression/Plan: ?Toni Mcgee is here for ophthalmic surgery. ? ?Risks, benefits, limitations, and alternatives regarding ophthalmic surgery have been reviewed with the patient.  Questions have been answered.  All parties agreeable. ? ? Leandrew Koyanagi, MD  05/04/2021, 7:35 AM ? ?

## 2021-05-04 NOTE — Anesthesia Preprocedure Evaluation (Signed)
Anesthesia Evaluation  ?Patient identified by MRN, date of birth, ID band ?Patient awake ? ? ? ?Reviewed: ?Allergy & Precautions, H&P , NPO status , Patient's Chart, lab work & pertinent test results ? ?Airway ?Mallampati: II ? ?TM Distance: >3 FB ?Neck ROM: full ? ? ? Dental ?no notable dental hx. ? ?  ?Pulmonary ?former smoker,  ?  ?Pulmonary exam normal ?breath sounds clear to auscultation ? ? ? ? ? ? Cardiovascular ?hypertension, Normal cardiovascular exam+ dysrhythmias  ?Rhythm:regular Rate:Normal ? ? ?  ?Neuro/Psych ?  ? GI/Hepatic ?  ?Endo/Other  ?Hypothyroidism overweight ? Renal/GU ?  ? ?  ?Musculoskeletal ? ? Abdominal ?  ?Peds ? Hematology ?  ?Anesthesia Other Findings ? ? Reproductive/Obstetrics ? ?  ? ? ? ? ? ? ? ? ? ? ? ? ? ?  ?  ? ? ? ? ? ? ? ? ?Anesthesia Physical ?Anesthesia Plan ? ?ASA: 2 ? ?Anesthesia Plan: MAC  ? ?Post-op Pain Management: Minimal or no pain anticipated  ? ?Induction:  ? ?PONV Risk Score and Plan: 2 and Treatment may vary due to age or medical condition, TIVA and Midazolam ? ?Airway Management Planned:  ? ?Additional Equipment:  ? ?Intra-op Plan:  ? ?Post-operative Plan:  ? ?Informed Consent: I have reviewed the patients History and Physical, chart, labs and discussed the procedure including the risks, benefits and alternatives for the proposed anesthesia with the patient or authorized representative who has indicated his/her understanding and acceptance.  ? ? ? ?Dental Advisory Given ? ?Plan Discussed with: CRNA ? ?Anesthesia Plan Comments:   ? ? ? ? ? ? ?Anesthesia Quick Evaluation ? ?

## 2021-05-04 NOTE — Transfer of Care (Signed)
Immediate Anesthesia Transfer of Care Note ? ?Patient: Toni Mcgee ? ?Procedure(s) Performed: CATARACT EXTRACTION PHACO AND INTRAOCULAR LENS PLACEMENT (IOC) LEFT eyhance toric lens (Left: Eye) ? ?Patient Location: PACU ? ?Anesthesia Type: MAC ? ?Level of Consciousness: awake, alert  and patient cooperative ? ?Airway and Oxygen Therapy: Patient Spontanous Breathing and Patient connected to supplemental oxygen ? ?Post-op Assessment: Post-op Vital signs reviewed, Patient's Cardiovascular Status Stable, Respiratory Function Stable, Patent Airway and No signs of Nausea or vomiting ? ?Post-op Vital Signs: Reviewed and stable ? ?Complications: No notable events documented. ? ?

## 2021-05-04 NOTE — Op Note (Signed)
LOCATION:  [facility] ?  ?PREOPERATIVE DIAGNOSIS:  Nuclear sclerotic cataract of the left eye.  H25.12 ? ?POSTOPERATIVE DIAGNOSIS:  Nuclear sclerotic cataract of the left eye. ?  ?PROCEDURE:  Phacoemulsification with Toric posterior chamber intraocular lens placement of the left eye. ? Ultrasound time: Procedure(s) with comments: ?CATARACT EXTRACTION PHACO AND INTRAOCULAR LENS PLACEMENT (IOC) LEFT eyhance toric lens (Left) - 9.76 ?01:00.9 ? ?LENS:   ?Implant Name Type Inv. Item Serial No. Manufacturer Lot No. LRB No. Used Action  ?LENS IOL [employer] II 23.0 - [id number]  LENS IOL Kizzie Bane II 23.0 [id number] [facility]  Left 1 Implanted  ?   [id number] Toric intraocular lens with 1.5 diopters of cylindrical power with axis orientation at 1 degrees. ?  ?  ?SURGEON:  Deirdre Evener, MD ?  ?ANESTHESIA:  Topical with tetracaine drops and 2% Xylocaine jelly, augmented with 1% preservative-free intracameral lidocaine. ? ?COMPLICATIONS:  None. ?  ?DESCRIPTION OF PROCEDURE:  The patient was identified in the holding room and transported to the operating suite and placed in the supine position under the operating microscope.  The left eye was identified as the operative eye, and it was prepped and draped in the usual sterile ophthalmic fashion.   ? ?A clear-corneal paracentesis incision was made at the 1:30 position.  0.5 ml of preservative-free 1% lidocaine was injected into the anterior chamber. ?The anterior chamber was filled with Viscoat.  A 2.4 millimeter near clear corneal incision was then made at the 10:30 position.  A cystotome and capsulorrhexis forceps were then used to make a curvilinear capsulorrhexis.  Hydrodissection and hydrodelineation were then performed using balanced salt solution. ?  ?Phacoemulsification was then used in stop and chop fashion to remove the lens, nucleus and epinucleus.  The remaining cortex was aspirated using the irrigation and aspiration handpiece.  Provisc  viscoelastic was then placed into the capsular bag to distend it for lens placement.  The Verion digital marker was used to align the implant at the intended axis. ?  ?A Toric lens was then injected into the capsular bag.  It was rotated clockwise until the axis marks on the lens were approximately 15 degrees in the counterclockwise direction to the intended alignment.  The viscoelastic was aspirated from the eye using the irrigation aspiration handpiece.  Then, a Koch spatula through the sideport incision was used to rotate the lens in a clockwise direction until the axis markings of the intraocular lens were lined up with the Verion alignment.  Balanced salt solution was then used to hydrate the wounds. Vigamox 0.2 ml of a 1mg  per ml solution was injected into the anterior chamber for a dose of 0.2 mg of intracameral antibiotic at the completion of the case. ? ?  ?The eye was noted to have a physiologic pressure and there was no wound leak noted.   Timolol and Brimonidine drops were applied to the eye.  The patient was taken to the recovery room in stable condition having had no complications of anesthesia or surgery. ? ?Jahanna Raether ?05/04/2021, 8:37 AM ? ?

## 2021-05-05 ENCOUNTER — Encounter: Payer: Self-pay | Admitting: Ophthalmology

## 2021-05-16 NOTE — Discharge Instructions (Signed)

## 2021-05-18 ENCOUNTER — Other Ambulatory Visit: Payer: Self-pay

## 2021-05-18 ENCOUNTER — Ambulatory Visit: Payer: Medicare HMO | Admitting: Anesthesiology

## 2021-05-18 ENCOUNTER — Encounter
Admission: RE | Disposition: A | Discharge status: Home / Self Care | Payer: Self-pay | Source: Home / Self Care | Attending: Ophthalmology

## 2021-05-18 ENCOUNTER — Encounter: Payer: Self-pay | Admitting: Ophthalmology

## 2021-05-18 ENCOUNTER — Ambulatory Visit
Admission: RE | Admit: 2021-05-18 | Discharge: 2021-05-18 | Disposition: A | Discharge status: Home / Self Care | Payer: Medicare HMO | Attending: Ophthalmology | Admitting: Ophthalmology

## 2021-05-18 DIAGNOSIS — E039 Hypothyroidism, unspecified: Secondary | ICD-10-CM | POA: Diagnosis not present

## 2021-05-18 DIAGNOSIS — Z87891 Personal history of nicotine dependence: Secondary | ICD-10-CM | POA: Diagnosis not present

## 2021-05-18 DIAGNOSIS — E663 Overweight: Secondary | ICD-10-CM | POA: Diagnosis not present

## 2021-05-18 DIAGNOSIS — Z6837 Body mass index (BMI) 37.0-37.9, adult: Secondary | ICD-10-CM | POA: Insufficient documentation

## 2021-05-18 DIAGNOSIS — I10 Essential (primary) hypertension: Secondary | ICD-10-CM | POA: Insufficient documentation

## 2021-05-18 DIAGNOSIS — H2511 Age-related nuclear cataract, right eye: Secondary | ICD-10-CM | POA: Insufficient documentation

## 2021-05-18 HISTORY — PX: CATARACT EXTRACTION W/PHACO: SHX586

## 2021-05-18 SURGERY — PHACOEMULSIFICATION, CATARACT, WITH IOL INSERTION
Anesthesia: Monitor Anesthesia Care | Site: Eye | Laterality: Right

## 2021-05-18 MED ORDER — LACTATED RINGERS IV SOLN: INTRAVENOUS | Status: DC

## 2021-05-18 MED ORDER — BRIMONIDINE TARTRATE-TIMOLOL 0.2-0.5 % OP SOLN
OPHTHALMIC | Status: DC | PRN
  Administered 2021-05-18: 1 [drp] via OPHTHALMIC

## 2021-05-18 MED ORDER — FENTANYL CITRATE (PF) 100 MCG/2ML IJ SOLN
INTRAMUSCULAR | Status: DC | PRN
  Administered 2021-05-18: 50 ug via INTRAVENOUS

## 2021-05-18 MED ORDER — ARMC OPHTHALMIC DILATING DROPS
1.0000 "application " | OPHTHALMIC | Status: DC | PRN
  Administered 2021-05-18 (×3): 1 via OPHTHALMIC

## 2021-05-18 MED ORDER — SIGHTPATH DOSE#1 NA HYALUR & NA CHOND-NA HYALUR IO KIT
PACK | INTRAOCULAR | Status: DC | PRN
Start: 2021-05-18 — End: 2021-05-18
  Administered 2021-05-18: 1 via OPHTHALMIC

## 2021-05-18 MED ORDER — TETRACAINE HCL 0.5 % OP SOLN
1.0000 [drp] | OPHTHALMIC | Status: DC | PRN
  Administered 2021-05-18 (×3): 1 [drp] via OPHTHALMIC

## 2021-05-18 MED ORDER — ACETAMINOPHEN 325 MG PO TABS: 325.0000 mg | ORAL_TABLET | Freq: Once | ORAL | Status: DC

## 2021-05-18 MED ORDER — ACETAMINOPHEN 160 MG/5ML PO SOLN: 325.0000 mg | Freq: Once | ORAL | Status: DC

## 2021-05-18 MED ORDER — MOXIFLOXACIN HCL 0.5 % OP SOLN
OPHTHALMIC | Status: DC | PRN
  Administered 2021-05-18: 0.2 mL via OPHTHALMIC

## 2021-05-18 MED ORDER — SIGHTPATH DOSE#1 BSS IO SOLN
INTRAOCULAR | Status: DC | PRN
  Administered 2021-05-18: 59 mL via OPHTHALMIC

## 2021-05-18 MED ORDER — MIDAZOLAM HCL 2 MG/2ML IJ SOLN
INTRAMUSCULAR | Status: DC | PRN
  Administered 2021-05-18: 2 mg via INTRAVENOUS

## 2021-05-18 MED ORDER — SIGHTPATH DOSE#1 BSS IO SOLN
INTRAOCULAR | Status: DC | PRN
  Administered 2021-05-18: 15 mL

## 2021-05-18 MED ORDER — LIDOCAINE HCL (PF) 2 % IJ SOLN
INTRAMUSCULAR | Status: DC | PRN
  Administered 2021-05-18: 1 mL via INTRAMUSCULAR

## 2021-05-18 SURGICAL SUPPLY — 13 items
CATARACT SUITE SIGHTPATH (MISCELLANEOUS) ×2 IMPLANT
FEE CATARACT SUITE SIGHTPATH (MISCELLANEOUS) ×1 IMPLANT
GLOVE SRG 8 PF TXTR STRL LF DI (GLOVE) ×1 IMPLANT
GLOVE SURG ENC TEXT LTX SZ7.5 (GLOVE) ×2 IMPLANT
GLOVE SURG UNDER POLY LF SZ8 (GLOVE) ×2
LENS IOL EYHANCE TORIC II 22.0 ×2 IMPLANT
LENS IOL EYHANCE TRC 150 22.0 IMPLANT
LENS IOL EYHNC TORIC 150 22.0 ×1 IMPLANT
NDL FILTER BLUNT 18X1 1/2 (NEEDLE) ×1 IMPLANT
NEEDLE FILTER BLUNT 18X 1/2SAF (NEEDLE) ×1
NEEDLE FILTER BLUNT 18X1 1/2 (NEEDLE) ×1 IMPLANT
SYR 3ML LL SCALE MARK (SYRINGE) ×2 IMPLANT
WATER STERILE IRR 250ML POUR (IV SOLUTION) ×2 IMPLANT

## 2021-05-18 NOTE — Anesthesia Postprocedure Evaluation (Signed)
Anesthesia Post Note ? ?Patient: Toni Mcgee ? ?Procedure(s) Performed: CATARACT EXTRACTION PHACO AND INTRAOCULAR LENS PLACEMENT (IOC) RIGHT eyhance toric lens (Right: Eye) ? ? ?  ?Patient location during evaluation: PACU ?Anesthesia Type: MAC ?Level of consciousness: awake and alert and oriented ?Pain management: satisfactory to patient ?Vital Signs Assessment: post-procedure vital signs reviewed and stable ?Respiratory status: spontaneous breathing, nonlabored ventilation and respiratory function stable ?Cardiovascular status: blood pressure returned to baseline and stable ?Postop Assessment: Adequate PO intake and No signs of nausea or vomiting ?Anesthetic complications: no ? ? ?No notable events documented. ? ?Raliegh Ip ? ? ? ? ? ?

## 2021-05-18 NOTE — Transfer of Care (Signed)
Immediate Anesthesia Transfer of Care Note ? ?Patient: Toni Mcgee ? ?Procedure(s) Performed: CATARACT EXTRACTION PHACO AND INTRAOCULAR LENS PLACEMENT (IOC) RIGHT eyhance toric lens (Right: Eye) ? ?Patient Location: PACU ? ?Anesthesia Type: MAC ? ?Level of Consciousness: awake, alert  and patient cooperative ? ?Airway and Oxygen Therapy: Patient Spontanous Breathing and Patient connected to supplemental oxygen ? ?Post-op Assessment: Post-op Vital signs reviewed, Patient's Cardiovascular Status Stable, Respiratory Function Stable, Patent Airway and No signs of Nausea or vomiting ? ?Post-op Vital Signs: Reviewed and stable ? ?Complications: No notable events documented. ? ?

## 2021-05-18 NOTE — Op Note (Signed)
LOCATION:  [facility] ?  ?PREOPERATIVE DIAGNOSIS:  Nuclear sclerotic cataract of the right eye.  H25.11 ?  ?POSTOPERATIVE DIAGNOSIS:  Nuclear sclerotic cataract of the right eye. ?  ?PROCEDURE:  Phacoemulsification with Toric posterior chamber intraocular lens placement of the right eye. ? Ultrasound time: Procedure(s) with comments: ?CATARACT EXTRACTION PHACO AND INTRAOCULAR LENS PLACEMENT (IOC) RIGHT eyhance toric lens (Right) - 4.14 ?00:43.5 ? ?LENS:   ?Implant Name Type Inv. Item Serial No. Manufacturer Lot No. LRB No. Used Action  ?LENS IOL [facility] II 22.0 - [id number]  LENS IOL Kizzie Bane II 22.0 [id number] [employer]  Right 1 Implanted  ?   [id number] 22.0 D Toric intraocular lens with 1.5 diopters of cylindrical power with axis orientation at 12 degrees. ?  ?SURGEON:  Deirdre Evener, MD ?  ?ANESTHESIA: Topical with tetracaine drops and 2% Xylocaine jelly, augmented with 1% preservative-free intracameral lidocaine. ?. ?  ?COMPLICATIONS:  None. ?  ?DESCRIPTION OF PROCEDURE:  The patient was identified in the holding room and transported to the operating suite and placed in the supine position under the operating microscope.  The right eye was identified as the operative eye, and it was prepped and draped in the usual sterile ophthalmic fashion.   ? ?A clear-corneal paracentesis incision was made at the 1:30 position.  0.5 ml of preservative-free 1% lidocaine was injected into the anterior chamber. ?The anterior chamber was filled with Viscoat.  A 2.4 millimeter near clear corneal incision was then made at the 10:30 position.  A cystotome and capsulorrhexis forceps were then used to make a curvilinear capsulorrhexis.  Hydrodissection and hydrodelineation were then performed using balanced salt solution. ?  ?Phacoemulsification was then used in stop and chop fashion to remove the lens, nucleus and epinucleus.  The remaining cortex was aspirated using the irrigation and aspiration  handpiece.  Provisc viscoelastic was then placed into the capsular bag to distend it for lens placement.  The Verion digital marker was used to align the implant at the intended axis. ?  ?A Toric lens was then injected into the capsular bag.  It was rotated clockwise until the axis marks on the lens were approximately 15 degrees in the counterclockwise direction to the intended alignment.  The viscoelastic was aspirated from the eye using the irrigation aspiration handpiece.  Then, a Koch spatula through the sideport incision was used to rotate the lens in a clockwise direction until the axis markings of the intraocular lens were lined up with the Verion alignment.  Balanced salt solution was then used to hydrate the wounds. Vigamox 0.2 ml of a 1mg  per ml solution was injected into the anterior chamber for a dose of 0.2 mg of intracameral antibiotic at the completion of the case. ? ?  ?The eye was noted to have a physiologic pressure and there was no wound leak noted.   Timolol and Brimonidine drops were applied to the eye.  The patient was taken to the recovery room in stable condition having had no complications of anesthesia or surgery. ? ?Toni Mcgee ?05/18/2021, 11:02 AM ? ?

## 2021-05-18 NOTE — Anesthesia Procedure Notes (Signed)
Procedure Name: [place] ?Date/Time: 05/18/2021 10:45 AM ?Performed by: Jeannene Patella, CRNA ?Pre-anesthesia Checklist: Patient identified, Emergency Drugs available, Suction available, Timeout performed and Patient being monitored ?Patient Re-evaluated:Patient Re-evaluated prior to induction ?Oxygen Delivery Method: Nasal cannula ?Placement Confirmation: positive ETCO2 ? ? ? ? ?

## 2021-05-18 NOTE — Anesthesia Preprocedure Evaluation (Signed)
Anesthesia Evaluation  ?Patient identified by MRN, date of birth, ID band ?Patient awake ? ? ? ?Reviewed: ?Allergy & Precautions, H&P , NPO status , Patient's Chart, lab work & pertinent test results ? ?Airway ?Mallampati: II ? ?TM Distance: >3 FB ?Neck ROM: full ? ? ? Dental ?no notable dental hx. ? ?  ?Pulmonary ?former smoker,  ?  ?Pulmonary exam normal ?breath sounds clear to auscultation ? ? ? ? ? ? Cardiovascular ?hypertension, Normal cardiovascular exam+ dysrhythmias  ?Rhythm:regular Rate:Normal ? ? ?  ?Neuro/Psych ?  ? GI/Hepatic ?  ?Endo/Other  ?Hypothyroidism overweight ? Renal/GU ?  ? ?  ?Musculoskeletal ? ? Abdominal ?  ?Peds ? Hematology ?  ?Anesthesia Other Findings ? ? Reproductive/Obstetrics ? ?  ? ? ? ? ? ? ? ? ? ? ? ? ? ?  ?  ? ? ? ? ? ? ? ? ?Anesthesia Physical ? ?Anesthesia Plan ? ?ASA: 2 ? ?Anesthesia Plan: MAC  ? ?Post-op Pain Management: Minimal or no pain anticipated  ? ?Induction:  ? ?PONV Risk Score and Plan: 2 and Treatment may vary due to age or medical condition, TIVA and Midazolam ? ?Airway Management Planned:  ? ?Additional Equipment:  ? ?Intra-op Plan:  ? ?Post-operative Plan:  ? ?Informed Consent: I have reviewed the patients History and Physical, chart, labs and discussed the procedure including the risks, benefits and alternatives for the proposed anesthesia with the patient or authorized representative who has indicated his/her understanding and acceptance.  ? ? ? ?Dental Advisory Given ? ?Plan Discussed with: CRNA ? ?Anesthesia Plan Comments:   ? ? ? ? ? ? ?Anesthesia Quick Evaluation ? ?

## 2021-05-18 NOTE — H&P (Signed)
Toni Mcgee  ? ?Primary Care Physician:  Leim Fabry, MD ?Ophthalmologist: Dr. Lockie Mola ? ?Pre-Procedure History & Physical: ?HPI:  Toni Mcgee is a 75 y.o. female here for ophthalmic surgery. ?  ?Past Medical History:  ?Diagnosis Date  ? Complication of anesthesia   ? During D&C in 1980s "died on the table" and was resusitated  ? Dental bridge present   ? permanent.  lower. both sided  ? Family history of adverse reaction to anesthesia   ? Duaghter - makes "Fidgety"  ? Hypertension   ? Hypothyroidism   ? Sciatica   ? right  ? ? ?Past Surgical History:  ?Procedure Laterality Date  ? ABDOMINAL HYSTERECTOMY    ? CATARACT EXTRACTION W/PHACO Left 05/04/2021  ? Procedure: CATARACT EXTRACTION PHACO AND INTRAOCULAR LENS PLACEMENT (IOC) LEFT eyhance toric lens;  Surgeon: Lockie Mola, MD;  Location: [facility];  Service: Ophthalmology;  Laterality: Left;  9.76 ?01:00.9  ? CHOLECYSTECTOMY    ? PARATHYROIDECTOMY    ? ? ?Prior to Admission medications   ?Medication Sig Start Date End Date Taking? Authorizing Provider  ?Boswellia-Glucosamine-Vit D (OSTEO BI-FLEX ONE PER DAY PO) Take by mouth daily.   Yes [provider]  ?chlorpheniramine (CHLOR-TRIMETON) 4 MG tablet Take 4 mg by mouth 2 (two) times daily as needed for allergies.   Yes [provider]  ?levothyroxine (SYNTHROID) 100 MCG tablet Take by mouth. 12/22/19 05/18/21 Yes [provider]  ?metoprolol succinate (TOPROL-XL) 25 MG 24 hr tablet Take by mouth. 10/20/19 05/18/21 Yes [provider]  ?Multiple Vitamin (MULTIVITAMIN) tablet Take 1 tablet by mouth daily.   Yes [provider]  ?naproxen sodium (ALEVE) 220 MG tablet Take 220 mg by mouth as needed.   Yes [provider]  ?Omega-3 Fatty Acids (FISH OIL) 1200 MG CAPS Take by mouth daily.   Yes [provider]  ?albuterol (VENTOLIN HFA) 108 (90 Base) MCG/ACT inhaler Inhale 1-2 puffs into the lungs every 6 (six)  hours as needed for up to 10 days for wheezing or shortness of breath. 01/20/20 01/30/20  Shirlee Latch, PA-C  ? ? ?Allergies as of 03/24/2021 - Review Complete 01/07/2021  ?Allergen Reaction Noted  ? Penicillins  07/13/2017  ? Prednisone  07/28/2020  ? Sulfa antibiotics  07/13/2017  ? Tylenol [acetaminophen]  07/13/2017  ? ? ?Family History  ?Problem Relation Age of Onset  ? Breast cancer Neg Hx   ? ? ?Social History  ? ?Socioeconomic History  ? Marital status: Widowed  ?  Spouse name: Not on file  ? Number of children: Not on file  ? Years of education: Not on file  ? Highest education level: Not on file  ?Occupational History  ? Not on file  ?Tobacco Use  ? Smoking status: Former  ?  Types: Cigarettes  ?  Quit date: 1968  ?  Years since quitting: 55.3  ? Smokeless tobacco: Never  ?Vaping Use  ? Vaping Use: Never used  ?Substance and Sexual Activity  ? Alcohol use: Yes  ?  Comment: rare  ? Drug use: Never  ? Sexual activity: Not on file  ?Other Topics Concern  ? Not on file  ?Social History Narrative  ? Not on file  ? ?Social Determinants of Health  ? ?Financial Resource Strain: Not on file  ?Food Insecurity: Not on file  ?Transportation Needs: Not on file  ?Physical Activity: Not on file  ?Stress: Not on file  ?Social Connections: Not on  file  ?Intimate Partner Violence: Not on file  ? ? ?Review of Systems: ?See HPI, otherwise negative ROS ? ?Physical Exam: ?BP (!) 149/78   Pulse 66   Temp (!) 97.3 ?F (36.3 ?C) (Temporal)   Resp 20   Wt 90.7 kg   SpO2 97%   BMI 37.79 kg/m?  ?General:   Alert,  pleasant and cooperative in NAD ?Head:  Normocephalic and atraumatic. ?Lungs:  Clear to auscultation.    ?Heart:  Regular rate and rhythm.  ? ?Impression/Plan: ?Toni Mcgee is here for ophthalmic surgery. ? ?Risks, benefits, limitations, and alternatives regarding ophthalmic surgery have been reviewed with the patient.  Questions have been answered.  All parties agreeable. ? ? Lockie Mola, MD   05/18/2021, 10:11 AM ? ? ?

## 2021-05-19 ENCOUNTER — Encounter: Payer: Self-pay | Admitting: Ophthalmology

## 2021-10-17 ENCOUNTER — Other Ambulatory Visit: Payer: Self-pay | Admitting: Family Medicine

## 2021-10-17 DIAGNOSIS — Z1231 Encounter for screening mammogram for malignant neoplasm of breast: Secondary | ICD-10-CM

## 2021-11-30 ENCOUNTER — Ambulatory Visit
Admission: RE | Admit: 2021-11-30 | Discharge: 2021-11-30 | Disposition: A | Discharge status: Home / Self Care | Payer: Medicare HMO | Source: Ambulatory Visit | Attending: Family Medicine | Admitting: Family Medicine

## 2021-11-30 DIAGNOSIS — Z1231 Encounter for screening mammogram for malignant neoplasm of breast: Secondary | ICD-10-CM | POA: Diagnosis present

## 2022-03-09 ENCOUNTER — Other Ambulatory Visit: Payer: Self-pay | Admitting: Student

## 2022-03-09 DIAGNOSIS — I4729 Other ventricular tachycardia: Secondary | ICD-10-CM

## 2022-04-18 ENCOUNTER — Telehealth (HOSPITAL_COMMUNITY): Payer: Self-pay | Admitting: *Deleted

## 2022-04-18 NOTE — Telephone Encounter (Signed)
Attempted to call patient regarding upcoming cardiac MRI appointment. Left message on voicemail with name and callback number  Jarquavious Fentress RN Navigator Cardiac Imaging [facility] [phone] Office [phone] Cell  

## 2022-04-19 ENCOUNTER — Other Ambulatory Visit: Payer: Self-pay | Admitting: Student

## 2022-04-19 ENCOUNTER — Ambulatory Visit
Admission: RE | Admit: 2022-04-19 | Discharge: 2022-04-19 | Disposition: A | Discharge status: Home / Self Care | Payer: Medicare HMO | Source: Ambulatory Visit | Attending: Student | Admitting: Student

## 2022-04-19 DIAGNOSIS — I4729 Other ventricular tachycardia: Secondary | ICD-10-CM

## 2022-04-19 MED ORDER — GADOBUTROL 1 MMOL/ML IV SOLN
12.0000 mL | Freq: Once | INTRAVENOUS | Status: AC | PRN
  Administered 2022-04-19: 12 mL via INTRAVENOUS

## 2022-10-20 ENCOUNTER — Other Ambulatory Visit: Payer: Self-pay | Admitting: Family Medicine

## 2022-10-20 DIAGNOSIS — Z1231 Encounter for screening mammogram for malignant neoplasm of breast: Secondary | ICD-10-CM

## 2022-12-05 ENCOUNTER — Ambulatory Visit
Admission: RE | Admit: 2022-12-05 | Discharge: 2022-12-05 | Disposition: A | Discharge status: Home / Self Care | Payer: Medicare HMO | Source: Ambulatory Visit | Attending: Family Medicine | Admitting: Family Medicine

## 2022-12-05 DIAGNOSIS — Z1231 Encounter for screening mammogram for malignant neoplasm of breast: Secondary | ICD-10-CM | POA: Insufficient documentation

## 2023-11-26 ENCOUNTER — Other Ambulatory Visit: Payer: Self-pay | Admitting: Family Medicine

## 2023-11-26 DIAGNOSIS — Z1231 Encounter for screening mammogram for malignant neoplasm of breast: Secondary | ICD-10-CM

## 2023-12-11 ENCOUNTER — Ambulatory Visit
Admission: RE | Admit: 2023-12-11 | Discharge: 2023-12-11 | Disposition: A | Discharge status: Home / Self Care | Source: Ambulatory Visit | Attending: Family Medicine | Admitting: Family Medicine

## 2023-12-11 DIAGNOSIS — Z1231 Encounter for screening mammogram for malignant neoplasm of breast: Secondary | ICD-10-CM | POA: Diagnosis present
